# Patient Record
Sex: Male | Born: 1965 | Race: White | Hispanic: No | Marital: Married | Smoking: Current every day smoker
Health system: Southern US, Community
[De-identification: ages and names within clinical notes are randomized; demographics above are authoritative.]

## PROBLEM LIST (undated history)

## (undated) DIAGNOSIS — J449 Chronic obstructive pulmonary disease, unspecified: Secondary | ICD-10-CM

## (undated) DIAGNOSIS — F419 Anxiety disorder, unspecified: Secondary | ICD-10-CM

## (undated) DIAGNOSIS — F32A Depression, unspecified: Secondary | ICD-10-CM

## (undated) DIAGNOSIS — M109 Gout, unspecified: Secondary | ICD-10-CM

## (undated) HISTORY — DX: Anxiety disorder, unspecified: F41.9

## (undated) HISTORY — DX: Depression, unspecified: F32.A

## (undated) HISTORY — PX: ESOPHAGOGASTRODUODENOSCOPY ENDOSCOPY: SHX5814

## (undated) HISTORY — PX: ELBOW SURGERY: SHX618

## (undated) HISTORY — DX: Gout, unspecified: M10.9

---

## 2004-11-05 ENCOUNTER — Ambulatory Visit: Payer: Self-pay

## 2004-12-09 ENCOUNTER — Encounter: Payer: Self-pay | Admitting: Neurosurgery

## 2004-12-16 ENCOUNTER — Encounter: Payer: Self-pay | Admitting: Neurosurgery

## 2005-01-16 ENCOUNTER — Encounter: Payer: Self-pay | Admitting: Neurosurgery

## 2006-07-02 ENCOUNTER — Emergency Department: Payer: Self-pay | Admitting: Emergency Medicine

## 2011-02-05 ENCOUNTER — Ambulatory Visit: Payer: Self-pay | Admitting: Unknown Physician Specialty

## 2011-02-09 LAB — PATHOLOGY REPORT

## 2012-11-17 ENCOUNTER — Emergency Department: Payer: Self-pay | Admitting: Emergency Medicine

## 2012-11-17 LAB — TROPONIN I
Troponin-I: <0.02 ng/mL
Troponin-I: <0.02 ng/mL

## 2012-11-17 LAB — CK TOTAL AND CKMB (NOT AT ARMC)
CK, Total: 154 U/L (ref 35–232)
CK, Total: 117 U/L (ref 35–232)
CK-MB: 1.2 ng/mL (ref 0.5–3.6)

## 2012-11-17 LAB — CBC
HGB: 14.4 g/dL (ref 13.0–18.0)
MCH: 29.3 pg (ref 26.0–34.0)
Platelet: 177 10*3/uL (ref 150–440)
RDW: 12.8 % (ref 11.5–14.5)
WBC: 7.6 10*3/uL (ref 3.8–10.6)

## 2012-11-17 LAB — BASIC METABOLIC PANEL
Anion Gap: 8 (ref 7–16)
BUN: 9 mg/dL (ref 7–18)
Calcium, Total: 8.5 mg/dL (ref 8.5–10.1)
Chloride: 105 mmol/L (ref 98–107)
Co2: 26 mmol/L (ref 21–32)
Creatinine: 0.78 mg/dL (ref 0.60–1.30)
EGFR (African American): >60
Osmolality: 276 (ref 275–301)
Potassium: 4.1 mmol/L (ref 3.5–5.1)

## 2013-04-16 ENCOUNTER — Emergency Department: Payer: Self-pay | Admitting: Emergency Medicine

## 2013-04-16 LAB — URINALYSIS, COMPLETE
Glucose,UR: NEGATIVE mg/dL (ref 0–75)
Nitrite: NEGATIVE
Specific Gravity: 1.029 (ref 1.003–1.030)
Squamous Epithelial: NONE SEEN
WBC UR: 4 /HPF (ref 0–5)

## 2013-04-16 LAB — CBC WITH DIFFERENTIAL/PLATELET
Basophil #: 0.1 10*3/uL (ref 0.0–0.1)
Eosinophil #: 0.1 10*3/uL (ref 0.0–0.7)
HGB: 14.7 g/dL (ref 13.0–18.0)
Lymphocyte #: 1.8 10*3/uL (ref 1.0–3.6)
Monocyte #: 0.4 x10 3/mm (ref 0.2–1.0)
Neutrophil %: 70.5 %
Platelet: 167 10*3/uL (ref 150–440)
RDW: 13.2 % (ref 11.5–14.5)
WBC: 8.2 10*3/uL (ref 3.8–10.6)

## 2013-04-16 LAB — BASIC METABOLIC PANEL
Chloride: 106 mmol/L (ref 98–107)
EGFR (African American): >60
EGFR (Non-African Amer.): >60
Glucose: 129 mg/dL — ABNORMAL HIGH (ref 65–99)
Potassium: 4.1 mmol/L (ref 3.5–5.1)

## 2014-05-29 ENCOUNTER — Ambulatory Visit: Payer: Self-pay

## 2015-11-19 DIAGNOSIS — M1A00X0 Idiopathic chronic gout, unspecified site, without tophus (tophi): Secondary | ICD-10-CM | POA: Insufficient documentation

## 2016-06-17 ENCOUNTER — Emergency Department: Payer: Worker's Compensation

## 2016-06-17 ENCOUNTER — Encounter: Payer: Self-pay | Admitting: Emergency Medicine

## 2016-06-17 ENCOUNTER — Emergency Department
Admission: EM | Admit: 2016-06-17 | Discharge: 2016-06-17 | Disposition: A | Discharge status: Home / Self Care | Payer: Worker's Compensation | Attending: Emergency Medicine | Admitting: Emergency Medicine

## 2016-06-17 ENCOUNTER — Emergency Department: Payer: Self-pay

## 2016-06-17 DIAGNOSIS — X501XXA Overexertion from prolonged static or awkward postures, initial encounter: Secondary | ICD-10-CM | POA: Diagnosis not present

## 2016-06-17 DIAGNOSIS — S39012A Strain of muscle, fascia and tendon of lower back, initial encounter: Secondary | ICD-10-CM | POA: Diagnosis not present

## 2016-06-17 DIAGNOSIS — Y9289 Other specified places as the place of occurrence of the external cause: Secondary | ICD-10-CM | POA: Insufficient documentation

## 2016-06-17 DIAGNOSIS — Y9301 Activity, walking, marching and hiking: Secondary | ICD-10-CM | POA: Insufficient documentation

## 2016-06-17 DIAGNOSIS — S3992XA Unspecified injury of lower back, initial encounter: Secondary | ICD-10-CM | POA: Diagnosis present

## 2016-06-17 DIAGNOSIS — W19XXXA Unspecified fall, initial encounter: Secondary | ICD-10-CM

## 2016-06-17 DIAGNOSIS — F17200 Nicotine dependence, unspecified, uncomplicated: Secondary | ICD-10-CM | POA: Insufficient documentation

## 2016-06-17 DIAGNOSIS — Y990 Civilian activity done for income or pay: Secondary | ICD-10-CM | POA: Insufficient documentation

## 2016-06-17 DIAGNOSIS — S8002XA Contusion of left knee, initial encounter: Secondary | ICD-10-CM | POA: Insufficient documentation

## 2016-06-17 DIAGNOSIS — S7002XA Contusion of left hip, initial encounter: Secondary | ICD-10-CM | POA: Diagnosis not present

## 2016-06-17 MED ORDER — DIAZEPAM 2 MG PO TABS
2.0000 mg | ORAL_TABLET | Freq: Once | ORAL | Status: AC
  Administered 2016-06-17: 2 mg via ORAL
  Filled 2016-06-17: qty 1

## 2016-06-17 MED ORDER — METHOCARBAMOL 500 MG PO TABS: 500.0000 mg | ORAL_TABLET | Freq: Four times a day (QID) | ORAL | 0 refills | Status: DC

## 2016-06-17 MED ORDER — OXYCODONE-ACETAMINOPHEN 5-325 MG PO TABS
1.0000 | ORAL_TABLET | Freq: Once | ORAL | Status: AC
  Administered 2016-06-17: 1 via ORAL
  Filled 2016-06-17: qty 1

## 2016-06-17 MED ORDER — MELOXICAM 15 MG PO TABS: 15.0000 mg | ORAL_TABLET | Freq: Every day | ORAL | 0 refills | Status: DC

## 2016-06-17 MED ORDER — HYDROCODONE-ACETAMINOPHEN 5-325 MG PO TABS: 1.0000 | ORAL_TABLET | ORAL | 0 refills | Status: DC | PRN

## 2016-06-17 NOTE — ED Triage Notes (Signed)
Brought in via ems from work  S/p fall  States he landed on left knee  But also having increased pain to left hip area

## 2016-06-17 NOTE — ED Provider Notes (Signed)
[facility] Emergency Department Provider Note  ____________________________________________  Time seen: Approximately 4:29 PM  I have reviewed the triage vital signs and the nursing notes.   HISTORY  Chief Complaint Fall    HPI Walter Mcclain. is a 50 y.o. male who presents to emergency department via EMS status post a injury at work. Patient states that he was working on the [employer] at [place] when someone pushed the next one more of behind him. Patient reports that he was walking around a lot more he was working on when he did not see the lawnmower, caught his foot, fell a twisting manner and landed on his knee and hip. Patient is reporting mild pain to the left knee but significant pain to the left hip. Patient did not hit his head or lose consciousness. No palliative care prior to arrival.He denies any numbness or tingling in bilateral lotion these. No bowel or bladder dysfunction.   History reviewed. No pertinent past medical history.  There are no active problems to display for this patient.   History reviewed. No pertinent surgical history.  Prior to Admission medications   Medication Sig Start Date End Date Taking? Authorizing Provider  HYDROcodone-acetaminophen (NORCO/VICODIN) 5-325 MG tablet Take 1 tablet by mouth every 4 (four) hours as needed for moderate pain. 06/17/16   Delorise Royals Sharnita Bogucki, PA-C  meloxicam (MOBIC) 15 MG tablet Take 1 tablet (15 mg total) by mouth daily. 06/17/16   Delorise Royals Phuong Moffatt, PA-C  methocarbamol (ROBAXIN) 500 MG tablet Take 1 tablet (500 mg total) by mouth 4 (four) times daily. 06/17/16   Delorise Royals Jakari Sada, PA-C    Allergies Review of patient's allergies indicates no known allergies.  No family history on file.  Social History Social History  Substance Use Topics  . Smoking status: Current Every Day Smoker  . Smokeless tobacco: Never Used  . Alcohol use No     Review of Systems   Constitutional: No fever/chills Cardiovascular: no chest pain. Respiratory: no cough. No SOB. Gastrointestinal: No abdominal pain.  No nausea, no vomiting.   Musculoskeletal: Positive for lower back, left hip, left knee pain Skin: Negative for rash, abrasions, lacerations, ecchymosis. Neurological: Negative for headaches, focal weakness or numbness. 10-point ROS otherwise negative.  ____________________________________________   PHYSICAL EXAM:  VITAL SIGNS: ED Triage Vitals  Enc Vitals Group     BP 06/17/16 1617 129/75     Pulse Rate 06/17/16 1617 75     Resp 06/17/16 1617 20     Temp 06/17/16 1617 98.1 F (36.7 C)     Temp Source 06/17/16 1617 Oral     SpO2 06/17/16 1617 97 %     Weight 06/17/16 1615 280 lb (127 kg)     Height 06/17/16 1615 6' (1.829 m)     Head Circumference --      Peak Flow --      Pain Score 06/17/16 1615 7     Pain Loc --      Pain Edu? --      Excl. in GC? --      Constitutional: Alert and oriented. Well appearing and in no acute distress. Eyes: Conjunctivae are normal. PERRL. EOMI. Head: Atraumatic. Neck: No stridor.  No cervical spine tenderness to palpation.  Cardiovascular: Normal rate, regular rhythm. Normal S1 and S2.  Good peripheral circulation. Respiratory: Normal respiratory effort without tachypnea or retractions. Lungs CTAB. Good air entry to the bases with no decreased or absent breath sounds. Musculoskeletal:  Full range of motion to all extremities. No gross deformities appreciated. No deformities noted to lower back but inspection. Patient has diffuse tenderness to palpation midline and left-sided paraspinal muscle group. No palpable abnormality. No tenderness to palpation over sciatic notch. Patient is mildly tender to palpation over the left lateral hip greater trochanteric region. No palpable abnormality. Full range of motion to the hip. No tenderness to palpation. Patient is nontender to palpation of the left knee. Full range of  motion left knee. Varus, valgus, Lachman's, McMurray's is negative. Sensation intact and equal lower extremities. Dorsalis pedis pulse intact and equal lower extremities. Neurologic:  Normal speech and language. No gross focal neurologic deficits are appreciated.  Skin:  Skin is warm, dry and intact. No rash noted. Psychiatric: Mood and affect are normal. Speech and behavior are normal. Patient exhibits appropriate insight and judgement.   ____________________________________________   LABS (all labs ordered are listed, but only abnormal results are displayed)  Labs Reviewed - No data to display ____________________________________________  EKG   ____________________________________________  RADIOLOGY Festus BarrenI, Kayden Hutmacher D Solara Goodchild, personally viewed and evaluated these images (plain radiographs) as part of my medical decision making, as well as reviewing the written report by the radiologist.  Dg Lumbar Spine Complete  Result Date: 06/17/2016 CLINICAL DATA:  Status post tripping and falling at work with back pain. EXAM: LUMBAR SPINE - COMPLETE 4+ VIEW COMPARISON:  May 29, 2014 FINDINGS: There is no evidence of lumbar spine fracture. There is grade 2 anterior listhesis of L5 on S1 unchanged. IMPRESSION: No acute fracture or dislocation. Electronically Signed   By: Sherian ReinWei-Chen  Lin M.D.   On: 06/17/2016 17:33   Dg Knee Complete 4 Views Left  Result Date: 06/17/2016 CLINICAL DATA:  Status post tripping and falling while at work with left knee pain EXAM: LEFT KNEE - COMPLETE 4+ VIEW COMPARISON:  None. FINDINGS: No evidence of fracture, dislocation, or joint effusion. No evidence of arthropathy or other focal bone abnormality. Soft tissues are unremarkable. IMPRESSION: Negative. Electronically Signed   By: Sherian ReinWei-Chen  Lin M.D.   On: 06/17/2016 17:30   Dg Hip Unilat With Pelvis 2-3 Views Left  Result Date: 06/17/2016 CLINICAL DATA:  Status post tripping and falling while at work with left hip pain  EXAM: DG HIP (WITH OR WITHOUT PELVIS) 2-3V LEFT COMPARISON:  None. FINDINGS: There is no evidence of hip fracture or dislocation. There are minimal decreased bilateral hip joint spaces. IMPRESSION: No acute fracture or dislocation. Electronically Signed   By: Sherian ReinWei-Chen  Lin M.D.   On: 06/17/2016 17:31    ____________________________________________    PROCEDURES  Procedure(s) performed:    Procedures    Medications  oxyCODONE-acetaminophen (PERCOCET/ROXICET) 5-325 MG per tablet 1 tablet (1 tablet Oral Given 06/17/16 1649)  diazepam (VALIUM) tablet 2 mg (2 mg Oral Given 06/17/16 1649)     ____________________________________________   INITIAL IMPRESSION / ASSESSMENT AND PLAN / ED COURSE  Pertinent labs & imaging results that were available during my care of the patient were reviewed by me and considered in my medical decision making (see chart for details).  Review of the [place] CSRS was performed in accordance of the [facility] prior to dispensing any controlled drugs.  Clinical Course    Patient's diagnosis is consistent with A fall resulting in left knee and hip contusions, and lumbar strain. X-ray reveals no acute osseous abnormality. Exam is reassuring. Patient reports improvement in symptoms after Valium and Percocet.. Patient will be discharged home with prescriptions for anti-inflammatories, muscle  relaxer, and very limited pain medication. Patient is to follow up with primary care provider as needed or otherwise directed. Patient is given ED precautions to return to the ED for any worsening or new symptoms.     ____________________________________________  FINAL CLINICAL IMPRESSION(S) / ED DIAGNOSES  Final diagnoses:  Fall, initial encounter  Contusion of left hip, initial encounter  Knee contusion, left, initial encounter  Lumbar strain, initial encounter      NEW MEDICATIONS STARTED DURING THIS VISIT:  New Prescriptions   HYDROCODONE-ACETAMINOPHEN (NORCO/VICODIN)  5-325 MG TABLET    Take 1 tablet by mouth every 4 (four) hours as needed for moderate pain.   MELOXICAM (MOBIC) 15 MG TABLET    Take 1 tablet (15 mg total) by mouth daily.   METHOCARBAMOL (ROBAXIN) 500 MG TABLET    Take 1 tablet (500 mg total) by mouth 4 (four) times daily.        This chart was dictated using voice recognition software/Dragon. Despite best efforts to proofread, errors can occur which can change the meaning. Any change was purely unintentional.    Racheal Patches, PA-C 06/17/16 1753    Rockne Menghini, MD 06/17/16 2000

## 2016-06-17 NOTE — ED Notes (Signed)
Pt completed workman's comp and and COC, pt provided urine specimen, pt given his copy of COC, COC and specimen hand delivered to lab

## 2018-06-17 IMAGING — CR DG LUMBAR SPINE COMPLETE 4+V
5 series · 5 of 5 positions shown · non-contrast
Comparison: May 29, 2014

CLINICAL DATA: Status post tripping and falling at work with back
pain.

EXAM:
LUMBAR SPINE - COMPLETE 4+ VIEW

[l-spine ap]
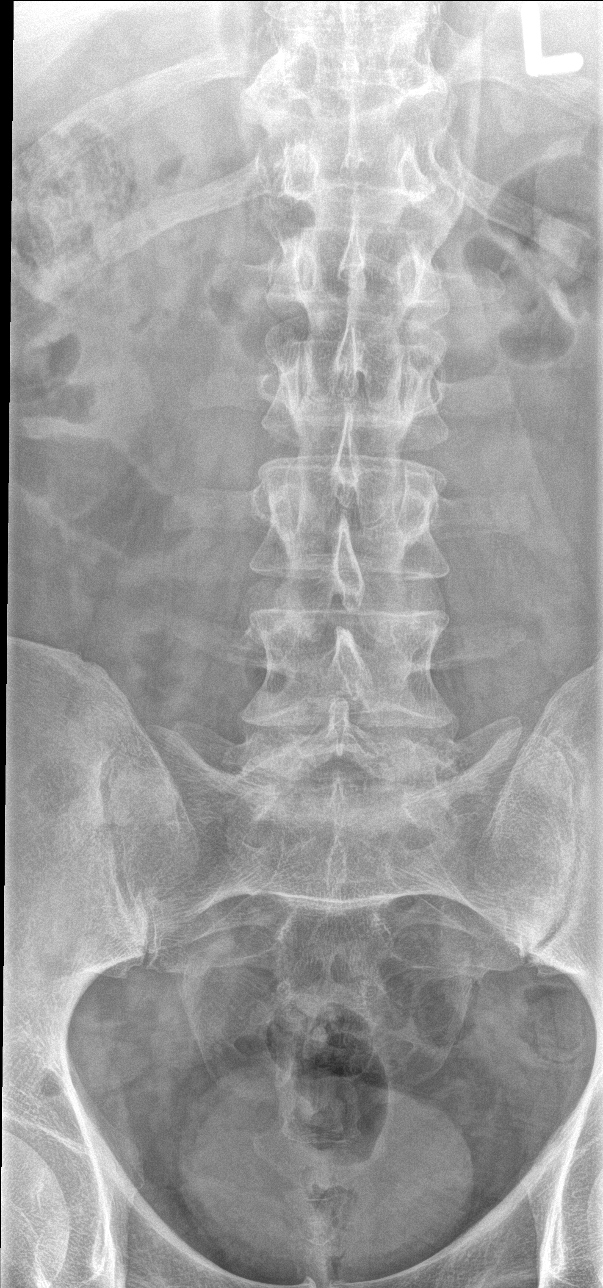

[l-spine obl (1 of 2)]
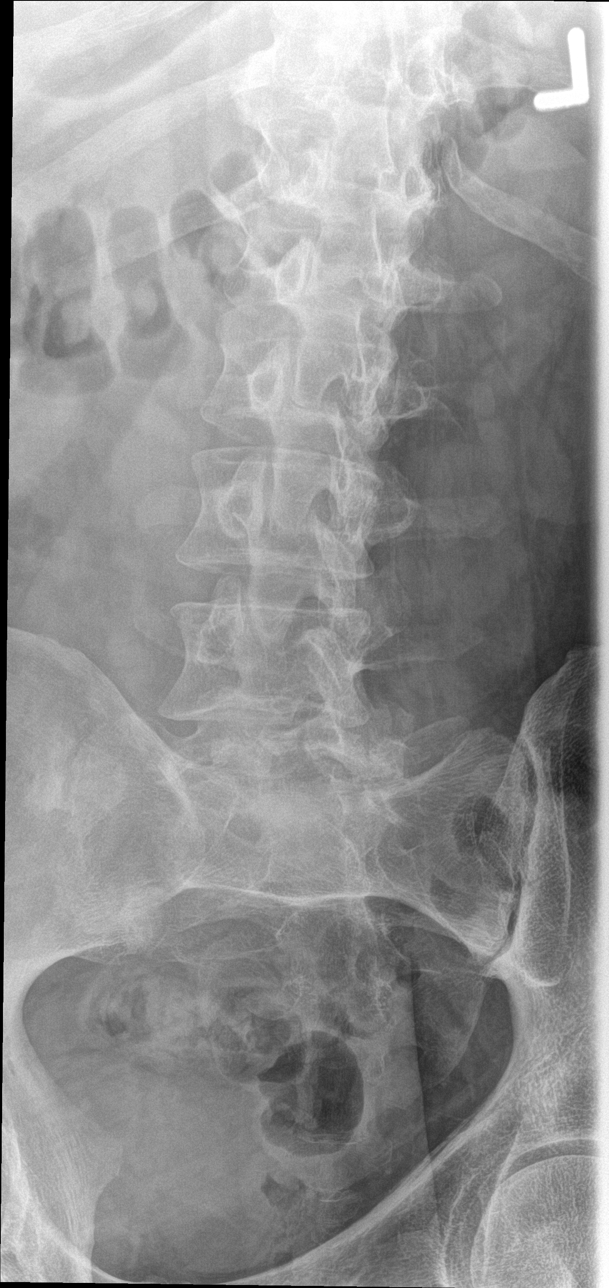

[l-spine obl (2 of 2)]
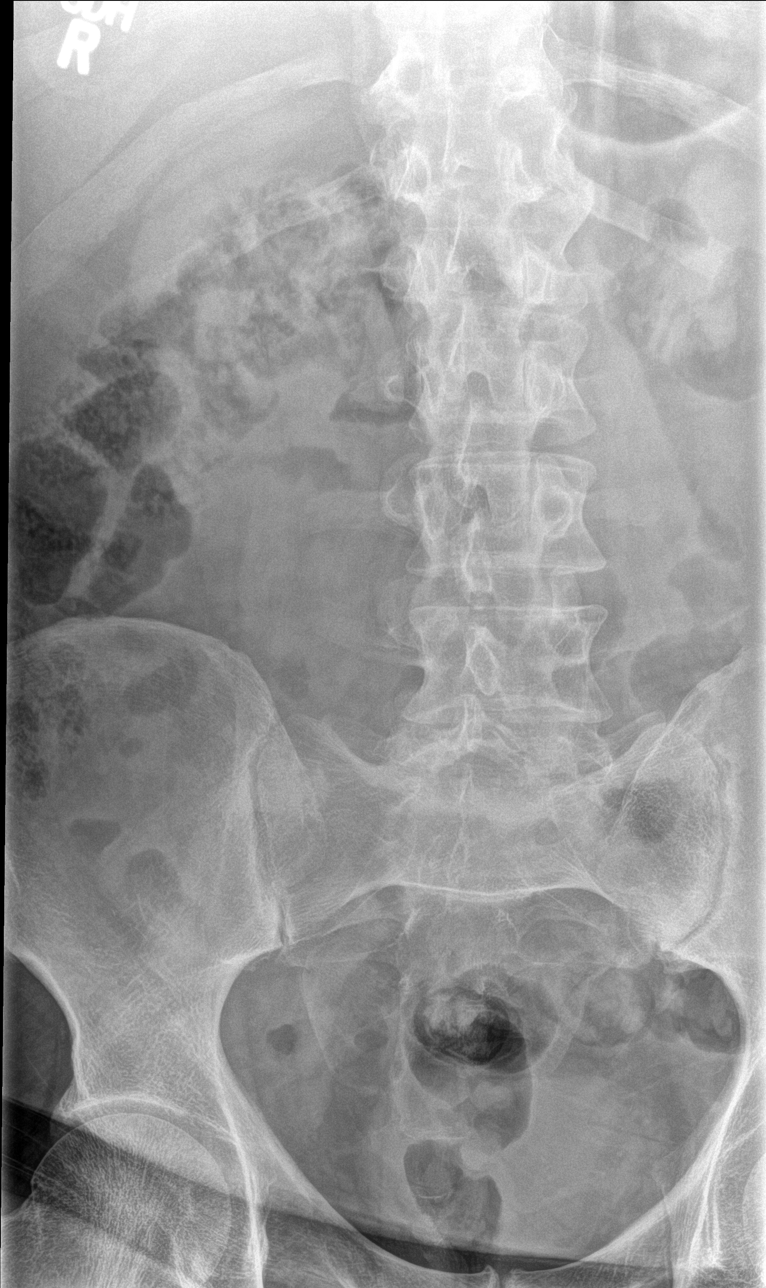

[l-spine lat]
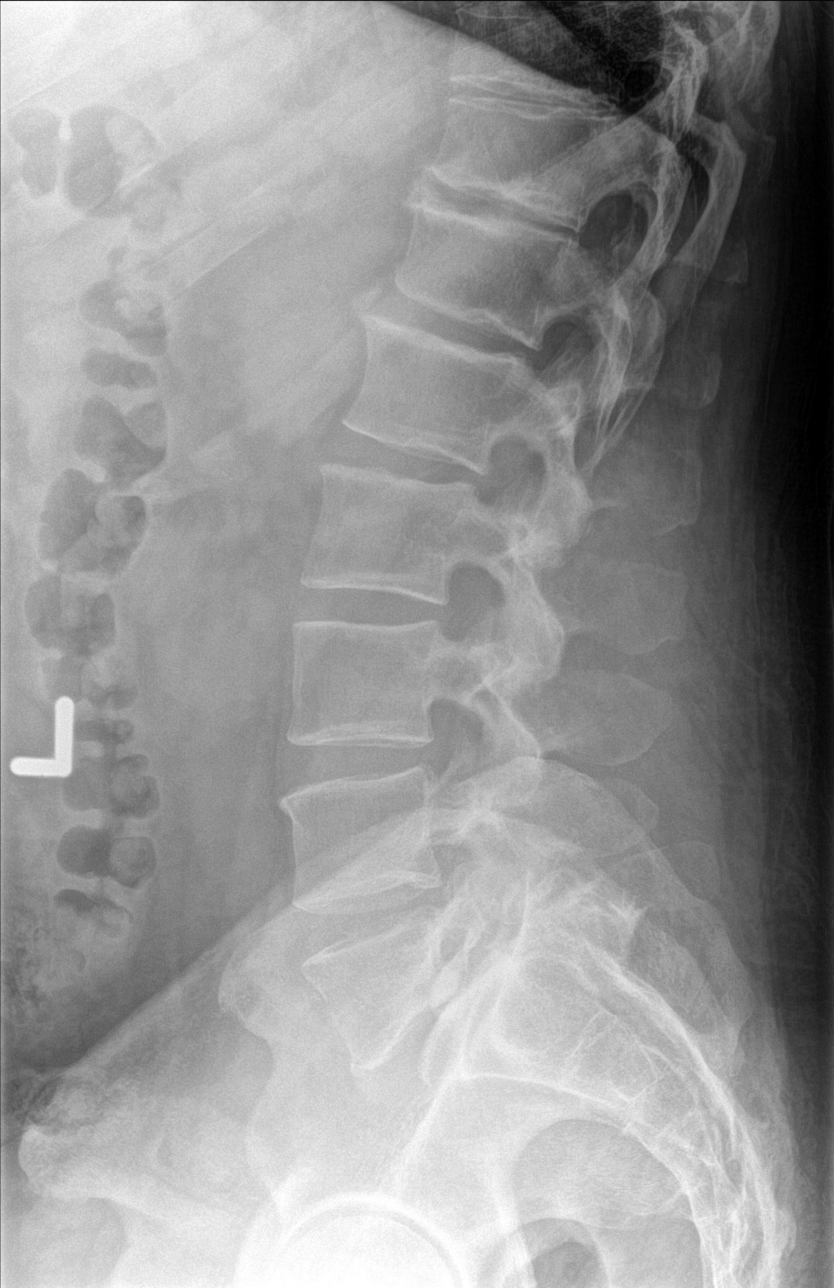

[l-spine spot]
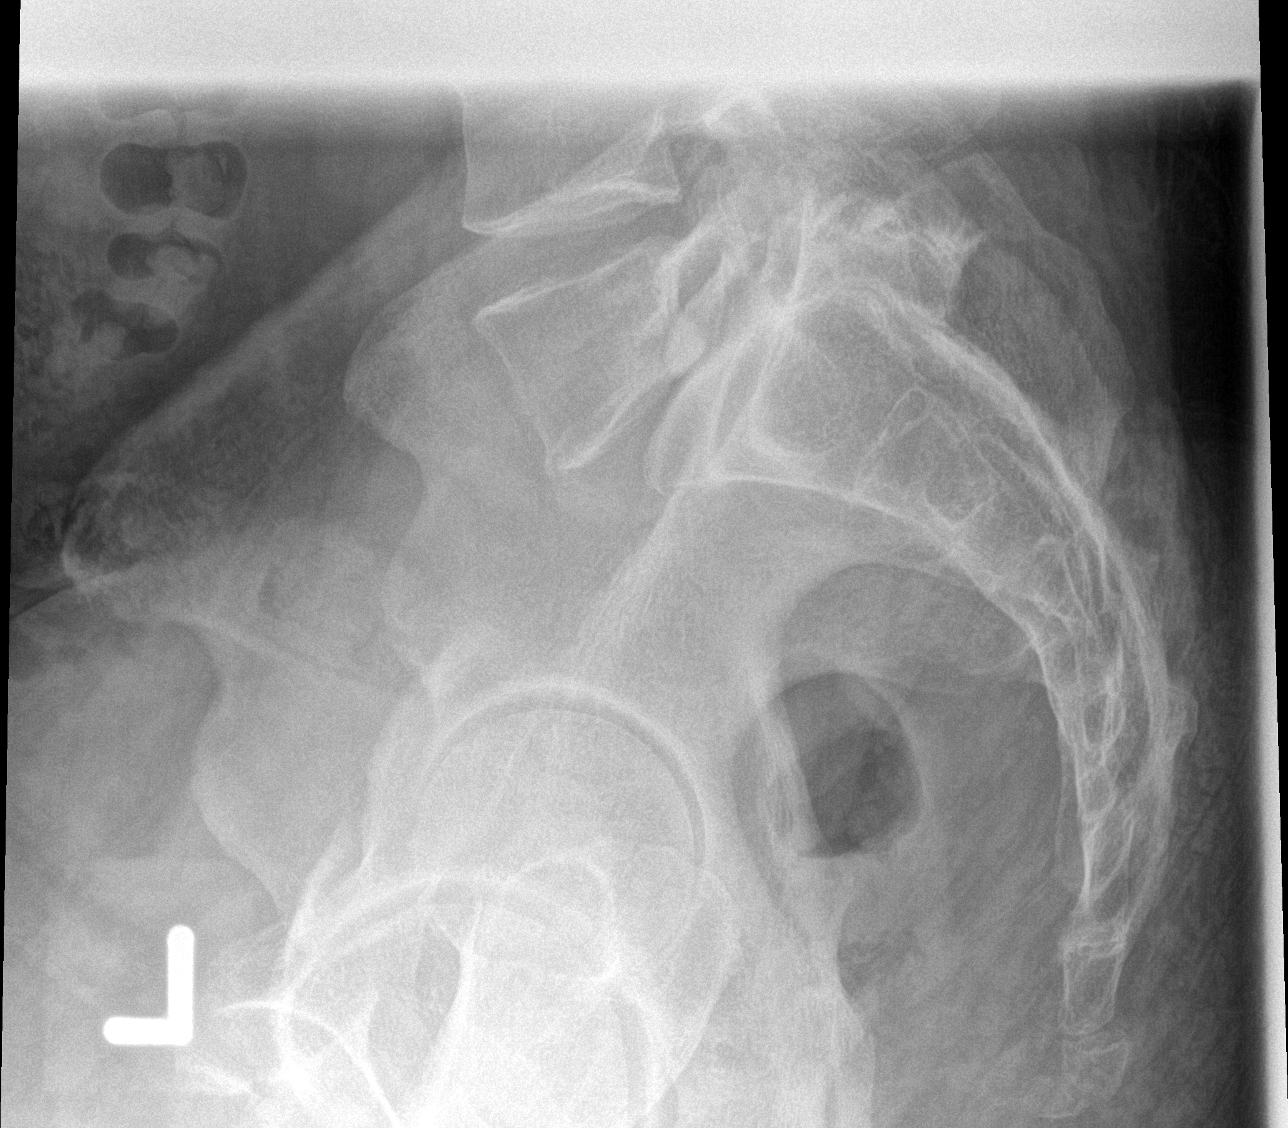

[5 of 5 positions shown; findings below may reference images not displayed]

FINDINGS: There is no evidence of lumbar spine fracture. There is grade 2
anterior listhesis of L5 on S1 unchanged.
IMPRESSION: No acute fracture or dislocation.

## 2020-04-01 ENCOUNTER — Ambulatory Visit: Payer: Managed Care, Other (non HMO) | Admitting: Family Medicine

## 2020-04-01 ENCOUNTER — Encounter: Payer: Self-pay | Admitting: Family Medicine

## 2020-04-01 ENCOUNTER — Other Ambulatory Visit: Payer: Self-pay

## 2020-04-01 VITALS — BP 130/88 | HR 73 | Temp 97.6°F | Ht 69.75 in | Wt 330.0 lb

## 2020-04-01 DIAGNOSIS — Z6841 Body Mass Index (BMI) 40.0 and over, adult: Secondary | ICD-10-CM

## 2020-04-01 DIAGNOSIS — Z7689 Persons encountering health services in other specified circumstances: Secondary | ICD-10-CM

## 2020-04-01 DIAGNOSIS — M1A0710 Idiopathic chronic gout, right ankle and foot, without tophus (tophi): Secondary | ICD-10-CM | POA: Diagnosis not present

## 2020-04-01 DIAGNOSIS — E669 Obesity, unspecified: Secondary | ICD-10-CM | POA: Insufficient documentation

## 2020-04-01 DIAGNOSIS — M545 Low back pain, unspecified: Secondary | ICD-10-CM | POA: Insufficient documentation

## 2020-04-01 DIAGNOSIS — G8929 Other chronic pain: Secondary | ICD-10-CM

## 2020-04-01 DIAGNOSIS — F17210 Nicotine dependence, cigarettes, uncomplicated: Secondary | ICD-10-CM | POA: Insufficient documentation

## 2020-04-01 DIAGNOSIS — K625 Hemorrhage of anus and rectum: Secondary | ICD-10-CM

## 2020-04-01 DIAGNOSIS — M109 Gout, unspecified: Secondary | ICD-10-CM | POA: Insufficient documentation

## 2020-04-01 MED ORDER — INDOMETHACIN 50 MG PO CAPS: 50.0000 mg | ORAL_CAPSULE | Freq: Two times a day (BID) | ORAL | 0 refills | Status: DC

## 2020-04-01 MED ORDER — BUPROPION HCL ER (XL) 150 MG PO TB24: 150.0000 mg | ORAL_TABLET | Freq: Every day | ORAL | 0 refills | Status: DC

## 2020-04-01 NOTE — Patient Instructions (Addendum)
Tart Cherry supplements to help naturally lower uric acid levels   Low-Purine Eating Plan A low-purine eating plan involves making food choices to limit your intake of purine. Purine is a kind of uric acid. Too much uric acid in your blood can cause certain conditions, such as gout and kidney stones. Eating a low-purine diet can help control these conditions. What are tips for following this plan? Reading food labels   Avoid foods with saturated or Trans fat.  Check the ingredient list of grains-based foods, such as bread and cereal, to make sure that they contain whole grains.  Check the ingredient list of sauces or soups to make sure they do not contain meat or fish.  When choosing soft drinks, check the ingredient list to make sure they do not contain high-fructose corn syrup. Shopping  Buy plenty of fresh fruits and vegetables.  Avoid buying canned or fresh fish.  Buy dairy products labeled as low-fat or nonfat.  Avoid buying premade or processed foods. These foods are often high in fat, salt (sodium), and added sugar. Cooking  Use olive oil instead of butter when cooking. Oils like olive oil, canola oil, and sunflower oil contain healthy fats. Meal planning  Learn which foods do or do not affect you. If you find out that a food tends to cause your gout symptoms to flare up, avoid eating that food. You can enjoy foods that do not cause problems. If you have any questions about a food item, talk with your dietitian or health care provider.  Limit foods high in fat, especially saturated fat. Fat makes it harder for your body to get rid of uric acid.  Choose foods that are lower in fat and are lean sources of protein. General guidelines  Limit alcohol intake to no more than 1 drink a day for nonpregnant women and 2 drinks a day for men. One drink equals 12 oz of beer, 5 oz of wine, or 1 oz of hard liquor. Alcohol can affect the way your body gets rid of uric acid.  Drink  plenty of water to keep your urine clear or pale yellow. Fluids can help remove uric acid from your body.  If directed by your health care provider, take a vitamin C supplement.  Work with your health care provider and dietitian to develop a plan to achieve or maintain a healthy weight. Losing weight can help reduce uric acid in your blood. What foods are recommended? The items listed may not be a complete list. Talk with your dietitian about what dietary choices are best for you. Foods low in purines Foods low in purines do not need to be limited. These include:  All fruits.  All low-purine vegetables, pickles, and olives.  Breads, pasta, rice, cornbread, and popcorn. Cake and other baked goods.  All dairy foods.  Eggs, nuts, and nut butters.  Spices and condiments, such as salt, herbs, and vinegar.  Plant oils, butter, and margarine.  Water, sugar-free soft drinks, tea, coffee, and cocoa.  Vegetable-based soups, broths, sauces, and gravies. Foods moderate in purines Foods moderate in purines should be limited to the amounts listed.   cup of asparagus, cauliflower, spinach, mushrooms, or green peas, each day.  2/3 cup uncooked oatmeal, each day.   cup dry wheat bran or wheat germ, each day.  2-3 ounces of meat or poultry, each day.  4-6 ounces of shellfish, such as crab, lobster, oysters, or shrimp, each day.  1 cup cooked beans, peas, or  lentils, each day.  Soup, broths, or bouillon made from meat or fish. Limit these foods as much as possible. What foods are not recommended? The items listed may not be a complete list. Talk with your dietitian about what dietary choices are best for you. Limit your intake of foods high in purines, including:  Beer and other alcohol.  Meat-based gravy or sauce.  Canned or fresh fish, such as: ? Anchovies, sardines, herring, and tuna. ? Mussels and scallops. ? Codfish, trout, and haddock.  Berniece Salines.  Organ meats, such  as: ? Liver or kidney. ? Tripe. ? Sweetbreads (thymus gland or pancreas).  Wild [employer] biochemist.  Yeast or yeast extract supplements.  Drinks sweetened with high-fructose corn syrup. Summary  Eating a low-purine diet can help control conditions caused by too much uric acid in the body, such as gout or kidney stones.  Choose low-purine foods, limit alcohol, and limit foods high in fat.  You will learn over time which foods do or do not affect you. If you find out that a food tends to cause your gout symptoms to flare up, avoid eating that food. This information is not intended to replace advice given to you by your health care provider. Make sure you discuss any questions you have with your health care provider. Document Revised: 09/16/2017 Document Reviewed: 11/17/2016 Elsevier Patient Education  2020 [employer].

## 2020-04-01 NOTE — Assessment & Plan Note (Signed)
Followed by [facility] for pain and therapy regimen, continue per their recommendations

## 2020-04-01 NOTE — Progress Notes (Signed)
BP 130/88 (BP Location: Left Arm, Patient Position: Sitting, Cuff Size: Large)   Pulse 73   Temp 97.6 F (36.4 C) (Oral)   Ht 5' 9.75" (1.772 m)   Wt (!) 330 lb (149.7 kg)   SpO2 96%   BMI 47.69 kg/m    Subjective:    Patient ID: Walter Carrow., male    DOB: [birth year], 54 y.o.   MRN: [id number]  HPI: Walter Degraffenreid. is a 54 y.o. male  Chief Complaint  Patient presents with  . Establish Care  . Edema    Swelling in both feet. Swelling in in left leg, left leg has new unusual pain.   . Rectal Bleeding  . Gout    Was previously on cochicine in 2018. Patient requesting refill for gout flares.    Here today to establish care.   Hx of gout, typically in right foot. Last seen for this in 2017 and has been on colchicine prn which he's been out of for over a year. Tries to control diet and fluid intake. Has flares off and on for which he takes OTC pain relievers  SOB at times with exertion, notes he's gained 50ish lb the past year since being out of work and thinks this is a big part of it. Smokes almost a ppd, has for many years. Tried chantix last year and had lip swelling with that. Has also tried patches which he did well with in the past but did not continue long term due to cost. Denies CP, diaphoresis, hx of cardiovascular dz.   Had an episode about 3 weeks ago with bright red rectal bleeding that happened after a BM and ceased after wiping. Notes he had eaten spicy cheetos prior to this episode. Denies anal pain, abdominal pain, constipation, hx of bowel disease. Due for colonoscopy.   Had an injury while working at [employer] several years ago, herniated several discs and has ongoing back issues/chronic pain. Following with [facility] for these issues, on percocet TID through them. Hoping to eventually have surgery.   Relevant past medical, surgical, family and social history reviewed and updated as indicated. Interim medical history since our last visit  reviewed. Allergies and medications reviewed and updated.  Review of Systems  Per HPI unless specifically indicated above     Objective:    BP 130/88 (BP Location: Left Arm, Patient Position: Sitting, Cuff Size: Large)   Pulse 73   Temp 97.6 F (36.4 C) (Oral)   Ht 5' 9.75" (1.772 m)   Wt (!) 330 lb (149.7 kg)   SpO2 96%   BMI 47.69 kg/m   Wt Readings from Last 3 Encounters:  04/01/20 (!) 330 lb (149.7 kg)  06/17/16 280 lb (127 kg)    Physical Exam Vitals and nursing note reviewed.  Constitutional:      Appearance: Normal appearance.  HENT:     Head: Atraumatic.  Eyes:     Extraocular Movements: Extraocular movements intact.     Conjunctiva/sclera: Conjunctivae normal.  Cardiovascular:     Rate and Rhythm: Normal rate and regular rhythm.  Pulmonary:     Effort: Pulmonary effort is normal.     Breath sounds: Normal breath sounds.  Abdominal:     General: Bowel sounds are normal. There is no distension.     Palpations: Abdomen is soft.     Tenderness: There is no abdominal tenderness. There is no right CVA tenderness, left CVA tenderness or guarding.  Genitourinary:  Comments: Declined by pt Musculoskeletal:        General: Normal range of motion.     Cervical back: Normal range of motion and neck supple.  Skin:    General: Skin is warm and dry.  Neurological:     General: No focal deficit present.     Mental Status: He is oriented to person, place, and time.  Psychiatric:        Mood and Affect: Mood normal.        Thought Content: Thought content normal.        Judgment: Judgment normal.     No results found for this or any previous visit.    Assessment & Plan:   Problem List Items Addressed This Visit      Other   Gout - Primary    Indomethacin prn, tart cherry supplements, diet changes. Recheck levels at upcoming CPE and may need to start allopurinol      Cigarette smoker    Will start wellbutrin, work on getting NRT covered (pt will call to  see if there are any cessation programs through insurance). Discussed quit date goals etc      Obesity    Start wellbutrin, restart exercise program and work on diet improvements. Continue to monitor      Chronic low back pain    Followed by [facility] for pain and therapy regimen, continue per their recommendations      Relevant Medications   oxyCODONE-acetaminophen (PERCOCET) 7.5-325 MG tablet   indomethacin (INDOCIN) 50 MG capsule    Other Visit Diagnoses    Encounter to establish care       Rectal bleeding       Suspect hemorrhoids, pt declines exam at this time as well as referral for colonoscopy. Continue to monitor, supportive care reviewed. F/u if recurring       Follow up plan: Return in about 4 weeks (around 04/29/2020) for CPE.

## 2020-04-01 NOTE — Assessment & Plan Note (Signed)
Will start wellbutrin, work on getting NRT covered (pt will call to see if there are any cessation programs through insurance). Discussed quit date goals etc

## 2020-04-01 NOTE — Assessment & Plan Note (Signed)
Start wellbutrin, restart exercise program and work on diet improvements. Continue to monitor

## 2020-04-01 NOTE — Assessment & Plan Note (Signed)
Indomethacin prn, tart cherry supplements, diet changes. Recheck levels at upcoming CPE and may need to start allopurinol

## 2020-04-23 ENCOUNTER — Other Ambulatory Visit: Payer: Self-pay | Admitting: Family Medicine

## 2020-04-24 ENCOUNTER — Other Ambulatory Visit: Payer: Self-pay | Admitting: Family Medicine

## 2020-04-28 ENCOUNTER — Encounter: Payer: Managed Care, Other (non HMO) | Admitting: Family Medicine

## 2020-05-08 ENCOUNTER — Other Ambulatory Visit: Payer: Self-pay

## 2020-05-08 ENCOUNTER — Encounter: Payer: Self-pay | Admitting: Family Medicine

## 2020-05-08 ENCOUNTER — Ambulatory Visit (INDEPENDENT_AMBULATORY_CARE_PROVIDER_SITE_OTHER): Payer: Managed Care, Other (non HMO) | Admitting: Family Medicine

## 2020-05-08 VITALS — BP 122/83 | HR 71 | Temp 97.9°F | Ht 70.0 in | Wt 319.0 lb

## 2020-05-08 DIAGNOSIS — Z1159 Encounter for screening for other viral diseases: Secondary | ICD-10-CM

## 2020-05-08 DIAGNOSIS — M545 Low back pain, unspecified: Secondary | ICD-10-CM

## 2020-05-08 DIAGNOSIS — M1A0710 Idiopathic chronic gout, right ankle and foot, without tophus (tophi): Secondary | ICD-10-CM | POA: Diagnosis not present

## 2020-05-08 DIAGNOSIS — Z6841 Body Mass Index (BMI) 40.0 and over, adult: Secondary | ICD-10-CM

## 2020-05-08 DIAGNOSIS — Z23 Encounter for immunization: Secondary | ICD-10-CM

## 2020-05-08 DIAGNOSIS — Z125 Encounter for screening for malignant neoplasm of prostate: Secondary | ICD-10-CM | POA: Diagnosis not present

## 2020-05-08 DIAGNOSIS — R0602 Shortness of breath: Secondary | ICD-10-CM

## 2020-05-08 DIAGNOSIS — Z1211 Encounter for screening for malignant neoplasm of colon: Secondary | ICD-10-CM | POA: Diagnosis not present

## 2020-05-08 DIAGNOSIS — G8929 Other chronic pain: Secondary | ICD-10-CM

## 2020-05-08 DIAGNOSIS — F17210 Nicotine dependence, cigarettes, uncomplicated: Secondary | ICD-10-CM | POA: Diagnosis not present

## 2020-05-08 DIAGNOSIS — J449 Chronic obstructive pulmonary disease, unspecified: Secondary | ICD-10-CM

## 2020-05-08 DIAGNOSIS — Z0000 Encounter for general adult medical examination without abnormal findings: Secondary | ICD-10-CM | POA: Diagnosis not present

## 2020-05-08 DIAGNOSIS — Z136 Encounter for screening for cardiovascular disorders: Secondary | ICD-10-CM

## 2020-05-08 DIAGNOSIS — Z114 Encounter for screening for human immunodeficiency virus [HIV]: Secondary | ICD-10-CM

## 2020-05-08 LAB — UA/M W/RFLX CULTURE, ROUTINE
Bilirubin, UA: NEGATIVE
Glucose, UA: NEGATIVE
Ketones, UA: NEGATIVE
Leukocytes,UA: NEGATIVE
Nitrite, UA: NEGATIVE
Protein,UA: NEGATIVE
RBC, UA: NEGATIVE
Specific Gravity, UA: >1.03 — ABNORMAL HIGH (ref 1.005–1.030)
Urobilinogen, Ur: 0.2 mg/dL (ref 0.2–1.0)
pH, UA: 5 (ref 5.0–7.5)

## 2020-05-08 MED ORDER — BUPROPION HCL ER (XL) 300 MG PO TB24: 300.0000 mg | ORAL_TABLET | Freq: Every day | ORAL | 0 refills | Status: DC

## 2020-05-08 MED ORDER — ALBUTEROL SULFATE (2.5 MG/3ML) 0.083% IN NEBU
2.5000 mg | INHALATION_SOLUTION | Freq: Once | RESPIRATORY_TRACT | Status: AC
  Administered 2020-05-08: 2.5 mg via RESPIRATORY_TRACT

## 2020-05-08 MED ORDER — ALBUTEROL SULFATE HFA 108 (90 BASE) MCG/ACT IN AERS: 2.0000 | INHALATION_SPRAY | Freq: Four times a day (QID) | RESPIRATORY_TRACT | 2 refills | Status: DC | PRN

## 2020-05-08 MED ORDER — INDOMETHACIN 50 MG PO CAPS: 50.0000 mg | ORAL_CAPSULE | Freq: Two times a day (BID) | ORAL | 0 refills | Status: DC | PRN

## 2020-05-08 MED ORDER — ANORO ELLIPTA 62.5-25 MCG/INH IN AEPB: 1.0000 | INHALATION_SPRAY | Freq: Every day | RESPIRATORY_TRACT | 2 refills | Status: DC

## 2020-05-08 NOTE — Progress Notes (Signed)
BP 122/83   Pulse 71   Temp 97.9 F (36.6 C) (Oral)   Ht 5\' 10"  (1.778 m)   Wt (!) 319 lb (144.7 kg)   SpO2 97%   BMI 45.77 kg/m    Subjective:    Patient ID: ., male    DOB: 01-19-1966, 53 y.o.   MRN: [id number]  HPI: Walter Mcclain. is a 54 y.o. male presenting on 05/08/2020 for comprehensive medical examination. Current medical complaints include:see below  Gout - has indomethacin for prn use, no recent gout flares. Tries to avoid trigger foods.   Obesity - down 11 lb since last visit by cutting back on breads and evening snacks. Only drinking about 1 soda daily right now. Wanting to start going to the gym with his wife soon.   Smoking - still working on quitting, on average smoking 1/2 ppd - 1 ppd, worse if he's fishing or doing something else similar. Dealing with worsening SOB, particularly with exertional activity. Has never been tested with a spirometer or on inhaler therapy. Some wheezing, chest tightness at times.   Chronic low back pain - Followed by orthopedics, currently on TID oxycodone.   He currently lives with: Interim Problems from his last visit: no  Depression Screen done today and results listed below:  Depression screen [facility] 2/9 05/08/2020  Decreased Interest 2  Down, Depressed, Hopeless 0  PHQ - 2 Score 2  Altered sleeping 0  Tired, decreased energy 1  Change in appetite 0  Feeling bad or failure about yourself  0  Trouble concentrating 0  Moving slowly or fidgety/restless 0  Suicidal thoughts 0  PHQ-9 Score 3    The patient does not have a history of falls. I did complete a risk assessment for falls. A plan of care for falls was documented.   Past Medical History:  History reviewed. No pertinent past medical history.  Surgical History:  History reviewed. No pertinent surgical history.  Medications:  Current Outpatient Medications on File Prior to Visit  Medication Sig  . oxyCODONE-acetaminophen (PERCOCET) 7.5-325  MG tablet Take 1 tablet by mouth 3 (three) times daily.   No current facility-administered medications on file prior to visit.    Allergies:  Allergies  Allergen Reactions  . Chantix [Varenicline] Other (See Comments)    Lip swelling    Social History:  Social History   Socioeconomic History  . Marital status: Married    Spouse name: Not on file  . Number of children: Not on file  . Years of education: Not on file  . Highest education level: Not on file  Occupational History  . Not on file  Tobacco Use  . Smoking status: Current Every Day Smoker    Packs/day: 1.00    Types: Cigarettes  . Smokeless tobacco: Never Used  Substance and Sexual Activity  . Alcohol use: No  . Drug use: Not Currently    Types: Marijuana    Comment: Patient states he does a "Gummy" every once in a while.   02-15-1975 Sexual activity: Not on file  Other Topics Concern  . Not on file  Social History Narrative  . Not on file   Social Determinants of Health   Financial Resource Strain:   . Difficulty of Paying Living Expenses:   Food Insecurity:   . Worried About Marland Kitchen in the Last Year:   . Programme researcher, broadcasting/film/video in the Last Year:   Transportation Needs:   .  Lack of Transportation (Medical):   Marland Kitchen Lack of Transportation (Non-Medical):   Physical Activity:   . Days of Exercise per Week:   . Minutes of Exercise per Session:   Stress:   . Feeling of Stress :   Social Connections:   . Frequency of Communication with Friends and Family:   . Frequency of Social Gatherings with Friends and Family:   . Attends Religious Services:   . Active Member of Clubs or Organizations:   . Attends Banker Meetings:   Marland Kitchen Marital Status:   Intimate Partner Violence:   . Fear of Current or Ex-Partner:   . Emotionally Abused:   Marland Kitchen Physically Abused:   . Sexually Abused:    Social History   Tobacco Use  Smoking Status Current Every Day Smoker  . Packs/day: 1.00  . Types: Cigarettes    Smokeless Tobacco Never Used   Social History   Substance and Sexual Activity  Alcohol Use No    Family History:  Family History  Problem Relation Age of Onset  . Brain cancer Mother   . Liver disease Father   . Heart disease Maternal Grandmother   . Liver disease Paternal Grandmother   . Other Paternal Grandfather        MVA    Past medical history, surgical history, medications, allergies, family history and social history reviewed with patient today and changes made to appropriate areas of the chart.   Review of Systems - General ROS: negative Psychological ROS: negative Ophthalmic ROS: negative ENT ROS: negative Allergy and Immunology ROS: negative Hematological and Lymphatic ROS: negative Endocrine ROS: negative Breast ROS: negative for breast lumps Respiratory ROS: positive for - shortness of breath Cardiovascular ROS: no chest pain or dyspnea on exertion Gastrointestinal ROS: no abdominal pain, change in bowel habits, or black or bloody stools Genito-Urinary ROS: no dysuria, trouble voiding, or hematuria Musculoskeletal ROS: negative Neurological ROS: no TIA or stroke symptoms Dermatological ROS: negative All other ROS negative except what is listed above and in the HPI.      Objective:    BP 122/83   Pulse 71   Temp 97.9 F (36.6 C) (Oral)   Ht  (1.778 m)   Wt (!) 319 lb (144.7 kg)   SpO2 97%   BMI 45.77 kg/m   Wt Readings from Last 3 Encounters:  05/08/20 (!) 319 lb (144.7 kg)  04/01/20 (!) 330 lb (149.7 kg)  06/17/16 280 lb (127 kg)    Physical Exam Vitals and nursing note reviewed.  Constitutional:      General: He is not in acute distress.    Appearance: He is well-developed.  HENT:     Head: Atraumatic.     Right Ear: Tympanic membrane and external ear normal.     Left Ear: Tympanic membrane and external ear normal.     Nose: Nose normal.     Mouth/Throat:     Mouth: Mucous membranes are moist.     Pharynx: Oropharynx is clear.   Eyes:     General: No scleral icterus.    Conjunctiva/sclera: Conjunctivae normal.     Pupils: Pupils are equal, round, and reactive to light.  Cardiovascular:     Rate and Rhythm: Normal rate and regular rhythm.     Heart sounds: Normal heart sounds. No murmur heard.   Pulmonary:     Effort: Pulmonary effort is normal. No respiratory distress.     Breath sounds: Wheezing (minimal, diffuse) present.  Abdominal:  General: Bowel sounds are normal. There is no distension.     Palpations: Abdomen is soft. There is no mass.     Tenderness: There is no abdominal tenderness. There is no guarding.  Genitourinary:    Comments: Declines GU exam Musculoskeletal:        General: No tenderness. Normal range of motion.     Cervical back: Normal range of motion and neck supple.  Skin:    General: Skin is warm and dry.     Findings: No rash.  Neurological:     General: No focal deficit present.     Mental Status: He is alert and oriented to person, place, and time.     Deep Tendon Reflexes: Reflexes are normal and symmetric.  Psychiatric:        Mood and Affect: Mood normal.        Behavior: Behavior normal.        Thought Content: Thought content normal.        Judgment: Judgment normal.     Results for orders placed or performed in visit on 05/08/20  HIV Antibody (routine testing w rflx)  Result Value Ref Range   HIV Screen 4th Generation wRfx Non Reactive Non Reactive  Hepatitis C antibody  Result Value Ref Range   Hep C Virus Ab <0.1 0.0 - 0.9 s/co ratio  CBC with Differential/Platelet  Result Value Ref Range   WBC 7.1 3.4 - 10.8 x10E3/uL   RBC 5.39 4.14 - 5.80 x10E6/uL   Hemoglobin 15.7 13.0 - 17.7 g/dL   Hematocrit 05.3 97.6 - 51.0 %   MCV 85 79 - 97 fL   MCH 29.1 26.6 - 33.0 pg   MCHC 34.3 31 - 35 g/dL   RDW 73.4 19.3 - 79.0 %   Platelets 204 150 - 450 x10E3/uL   Neutrophils 61 Not Estab. %   Lymphs 29 Not Estab. %   Monocytes 5 Not Estab. %   Eos 3 Not Estab. %    Basos 1 Not Estab. %   Neutrophils Absolute 4.4 1 - 7 x10E3/uL   Lymphocytes Absolute 2.1 0 - 3 x10E3/uL   Monocytes Absolute 0.4 0 - 0 x10E3/uL   EOS (ABSOLUTE) 0.2 0.0 - 0.4 x10E3/uL   Basophils Absolute 0.1 0 - 0 x10E3/uL   Immature Granulocytes 1 Not Estab. %   Immature Grans (Abs) 0.0 0.0 - 0.1 x10E3/uL  Comprehensive metabolic panel  Result Value Ref Range   Glucose 104 (H) 65 - 99 mg/dL   BUN 9 6 - 24 mg/dL   Creatinine, Ser 2.40 0.76 - 1.27 mg/dL   GFR calc non Af Amer 80 >59 mL/min/1.73   GFR calc Af Amer 93 >59 mL/min/1.73   BUN/Creatinine Ratio 8 (L) 9 - 20   Sodium 139 134 - 144 mmol/L   Potassium 4.2 3.5 - 5.2 mmol/L   Chloride 102 96 - 106 mmol/L   CO2 22 20 - 29 mmol/L   Calcium 9.3 8.7 - 10.2 mg/dL   Total Protein 7.3 6.0 - 8.5 g/dL   Albumin 4.4 3.8 - 4.9 g/dL   Globulin, Total 2.9 1.5 - 4.5 g/dL   Albumin/Globulin Ratio 1.5 1.2 - 2.2   Bilirubin Total 0.6 0.0 - 1.2 mg/dL   Alkaline Phosphatase 167 (H) 48 - 121 IU/L   AST 43 (H) 0 - 40 IU/L   ALT 39 0 - 44 IU/L  Lipid Panel w/o Chol/HDL Ratio  Result Value Ref Range   Cholesterol,  Total 173 100 - 199 mg/dL   Triglycerides 119199 (H) 0 - 149 mg/dL   HDL 25 (L) >14>39 mg/dL   VLDL Cholesterol Cal 35 5 - 40 mg/dL   LDL Chol Calc (NIH) 782113 (H) 0 - 99 mg/dL  UA/M w/rflx Culture, Routine   Specimen: Urine   Urine  Result Value Ref Range   Specific Gravity, UA >1.030 (H) 1.005 - 1.030   pH, UA 5.0 5.0 - 7.5   Color, UA Yellow Yellow   Appearance Ur Clear Clear   Leukocytes,UA Negative Negative   Protein,UA Negative Negative/Trace   Glucose, UA Negative Negative   Ketones, UA Negative Negative   RBC, UA Negative Negative   Bilirubin, UA Negative Negative   Urobilinogen, Ur 0.2 0.2 - 1.0 mg/dL   Nitrite, UA Negative Negative  Uric acid  Result Value Ref Range   Uric Acid 8.0 3.8 - 8.4 mg/dL  PSA  Result Value Ref Range   Prostate Specific Ag, Serum 0.9 0.0 - 4.0 ng/mL      Assessment & Plan:   Problem  List Items Addressed This Visit      Respiratory   COPD (chronic obstructive pulmonary disease) (HCC)    Per spirometry results today. Start anoro, prn albuterol. Inhaler use reviewed with patient. Continue working toward smoking cessation goals.       Relevant Medications   albuterol (VENTOLIN HFA) 108 (90 Base) MCG/ACT inhaler   umeclidinium-vilanterol (ANORO ELLIPTA) 62.5-25 MCG/INH AEPB     Other   Gout - Primary    Stable, continue diet modifications, try tart cherry supplements, indomethacin prn. Recheck uric acid      Relevant Orders   Uric acid (Completed)   Cigarette smoker    NRT, habit replacement counseling reviewed      Obesity    Discussed diet and exercise changes. Congratulated his success thus far      Chronic low back pain    Follwed by Orthopedics, continue pain control per their recommendations      Relevant Medications   indomethacin (INDOCIN) 50 MG capsule    Other Visit Diagnoses    Annual physical exam       Relevant Orders   CBC with Differential/Platelet (Completed)   Comprehensive metabolic panel (Completed)   UA/M w/rflx Culture, Routine (Completed)   SOB (shortness of breath)       Relevant Medications   albuterol (PROVENTIL) (2.5 MG/3ML) 0.083% nebulizer solution 2.5 mg (Completed)   Other Relevant Orders   Spirometry with graph (Completed)   Colon cancer screening       Relevant Orders   Ambulatory referral to Gastroenterology   Screening for prostate cancer       Relevant Orders   PSA (Completed)   Need for diphtheria-tetanus-pertussis (Tdap) vaccine       Relevant Orders   Tdap vaccine greater than or equal to 7yo IM (Completed)   Need for hepatitis C screening test       Relevant Orders   Hepatitis C antibody (Completed)   Encounter for screening for HIV       Relevant Orders   HIV Antibody (routine testing w rflx) (Completed)   Screening for cardiovascular condition       Relevant Orders   Lipid Panel w/o Chol/HDL Ratio  (Completed)       Discussed aspirin prophylaxis for myocardial infarction prevention and decision was it was not indicated  LABORATORY TESTING:  Health maintenance labs ordered today as discussed above.  The natural history of prostate cancer and ongoing controversy regarding screening and potential treatment outcomes of prostate cancer has been discussed with the patient. The meaning of a false positive PSA and a false negative PSA has been discussed. He indicates understanding of the limitations of this screening test and wishes to proceed with screening PSA testing.   IMMUNIZATIONS:   - Tdap: Tetanus vaccination status reviewed: Td vaccination indicated and given today. - Influenza: Up to date - Pneumovax: Not applicable - Prevnar: Not applicable - HPV: Not applicable - Zostavax vaccine: Not applicable  SCREENING: - Colonoscopy: Ordered today  Discussed with patient purpose of the colonoscopy is to detect colon cancer at curable precancerous or early stages   -Spirometry: Ordered today   PATIENT COUNSELING:    Sexuality: Discussed sexually transmitted diseases, partner selection, use of condoms, avoidance of unintended pregnancy  and contraceptive alternatives.   Advised to avoid cigarette smoking.  I discussed with the patient that most people either abstain from alcohol or drink within safe limits (<=14/week and <=4 drinks/occasion for males, <=7/weeks and <= 3 drinks/occasion for females) and that the risk for alcohol disorders and other health effects rises proportionally with the number of drinks per week and how often a drinker exceeds daily limits.  Discussed cessation/primary prevention of drug use and availability of treatment for abuse.   Diet: Encouraged to adjust caloric intake to maintain  or achieve ideal body weight, to reduce intake of dietary saturated fat and total fat, to limit sodium intake by avoiding high sodium foods and not adding table salt, and to  maintain adequate dietary potassium and calcium preferably from fresh fruits, vegetables, and low-fat dairy products.    stressed the importance of regular exercise  Injury prevention: Discussed safety belts, safety helmets, smoke detector, smoking near bedding or upholstery.   Dental health: Discussed importance of regular tooth brushing, flossing, and dental visits.   Follow up plan: NEXT PREVENTATIVE PHYSICAL DUE IN 1 YEAR. Return in about 6 weeks (around 06/19/2020) for SOB, weight f/u.

## 2020-05-09 LAB — COMPREHENSIVE METABOLIC PANEL
ALT: 39 IU/L (ref 0–44)
AST: 43 IU/L — ABNORMAL HIGH (ref 0–40)
Albumin/Globulin Ratio: 1.5 (ref 1.2–2.2)
Albumin: 4.4 g/dL (ref 3.8–4.9)
Alkaline Phosphatase: 167 IU/L — ABNORMAL HIGH (ref 48–121)
BUN/Creatinine Ratio: 8 — ABNORMAL LOW (ref 9–20)
BUN: 9 mg/dL (ref 6–24)
Bilirubin Total: 0.6 mg/dL (ref 0.0–1.2)
CO2: 22 mmol/L (ref 20–29)
Calcium: 9.3 mg/dL (ref 8.7–10.2)
Chloride: 102 mmol/L (ref 96–106)
Creatinine, Ser: 1.06 mg/dL (ref 0.76–1.27)
GFR calc Af Amer: 93 mL/min/{1.73_m2} (ref >59)
GFR calc non Af Amer: 80 mL/min/{1.73_m2} (ref >59)
Globulin, Total: 2.9 g/dL (ref 1.5–4.5)
Glucose: 104 mg/dL — ABNORMAL HIGH (ref 65–99)
Potassium: 4.2 mmol/L (ref 3.5–5.2)
Sodium: 139 mmol/L (ref 134–144)
Total Protein: 7.3 g/dL (ref 6.0–8.5)

## 2020-05-09 LAB — CBC WITH DIFFERENTIAL/PLATELET
Basophils Absolute: 0.1 10*3/uL (ref 0.0–0.2)
Basos: 1 %
EOS (ABSOLUTE): 0.2 10*3/uL (ref 0.0–0.4)
Eos: 3 %
Hematocrit: 45.8 % (ref 37.5–51.0)
Hemoglobin: 15.7 g/dL (ref 13.0–17.7)
Immature Grans (Abs): 0 10*3/uL (ref 0.0–0.1)
Immature Granulocytes: 1 %
Lymphocytes Absolute: 2.1 10*3/uL (ref 0.7–3.1)
Lymphs: 29 %
MCH: 29.1 pg (ref 26.6–33.0)
MCHC: 34.3 g/dL (ref 31.5–35.7)
MCV: 85 fL (ref 79–97)
Monocytes Absolute: 0.4 10*3/uL (ref 0.1–0.9)
Monocytes: 5 %
Neutrophils Absolute: 4.4 10*3/uL (ref 1.4–7.0)
Neutrophils: 61 %
Platelets: 204 10*3/uL (ref 150–450)
RBC: 5.39 x10E6/uL (ref 4.14–5.80)
RDW: 12.7 % (ref 11.6–15.4)
WBC: 7.1 10*3/uL (ref 3.4–10.8)

## 2020-05-09 LAB — PSA: Prostate Specific Ag, Serum: 0.9 ng/mL (ref 0.0–4.0)

## 2020-05-09 LAB — HIV ANTIBODY (ROUTINE TESTING W REFLEX): HIV Screen 4th Generation wRfx: NONREACTIVE

## 2020-05-09 LAB — LIPID PANEL W/O CHOL/HDL RATIO
Cholesterol, Total: 173 mg/dL (ref 100–199)
HDL: 25 mg/dL — ABNORMAL LOW
LDL Chol Calc (NIH): 113 mg/dL — ABNORMAL HIGH (ref 0–99)
Triglycerides: 199 mg/dL — ABNORMAL HIGH (ref 0–149)
VLDL Cholesterol Cal: 35 mg/dL (ref 5–40)

## 2020-05-09 LAB — HEPATITIS C ANTIBODY: Hep C Virus Ab: <0.1 s/co ratio (ref 0.0–0.9)

## 2020-05-09 LAB — URIC ACID: Uric Acid: 8 mg/dL (ref 3.8–8.4)

## 2020-05-11 DIAGNOSIS — J449 Chronic obstructive pulmonary disease, unspecified: Secondary | ICD-10-CM | POA: Insufficient documentation

## 2020-05-11 NOTE — Assessment & Plan Note (Signed)
Discussed diet and exercise changes. Congratulated his success thus far

## 2020-05-11 NOTE — Assessment & Plan Note (Signed)
Per spirometry results today. Start anoro, prn albuterol. Inhaler use reviewed with patient. Continue working toward smoking cessation goals.

## 2020-05-11 NOTE — Assessment & Plan Note (Signed)
NRT, habit replacement counseling reviewed

## 2020-05-11 NOTE — Assessment & Plan Note (Signed)
Stable, continue diet modifications, try tart cherry supplements, indomethacin prn. Recheck uric acid

## 2020-05-11 NOTE — Assessment & Plan Note (Signed)
Follwed by Orthopedics, continue pain control per their recommendations

## 2020-05-12 ENCOUNTER — Telehealth: Payer: Self-pay

## 2020-05-12 NOTE — Telephone Encounter (Signed)
Patient notified of form ready for pick up 

## 2020-05-19 ENCOUNTER — Telehealth (INDEPENDENT_AMBULATORY_CARE_PROVIDER_SITE_OTHER): Payer: Self-pay | Admitting: Gastroenterology

## 2020-05-19 ENCOUNTER — Other Ambulatory Visit (INDEPENDENT_AMBULATORY_CARE_PROVIDER_SITE_OTHER): Payer: Self-pay

## 2020-05-19 DIAGNOSIS — Z1211 Encounter for screening for malignant neoplasm of colon: Secondary | ICD-10-CM

## 2020-05-19 NOTE — Progress Notes (Signed)
Gastroenterology Pre-Procedure Review  Request Date: Monday 06/30/20 Requesting Physician: Dr. Allegra Lai  PATIENT REVIEW QUESTIONS: The patient responded to the following health history questions as indicated:    1. Are you having any GI issues? no 2. Do you have a personal history of Polyps? no 3. Do you have a family history of Colon Cancer or Polyps? no 4. Diabetes Mellitus? no 5. Joint replacements in the past 12 months?no 6. Major health problems in the past 3 months?no 7. Any artificial heart valves, MVP, or defibrillator?no    MEDICATIONS & ALLERGIES:    Patient reports the following regarding taking any anticoagulation/antiplatelet therapy:   Plavix, Coumadin, Eliquis, Xarelto, Lovenox, Pradaxa, Brilinta, or Effient? no Aspirin? no  Patient confirms/reports the following medications:  Current Outpatient Medications  Medication Sig Dispense Refill  . albuterol (VENTOLIN HFA) 108 (90 Base) MCG/ACT inhaler Inhale 2 puffs into the lungs every 6 (six) hours as needed for wheezing or shortness of breath. 18 g 2  . buPROPion (WELLBUTRIN XL) 300 MG 24 hr tablet Take 1 tablet (300 mg total) by mouth daily. 90 tablet 0  . indomethacin (INDOCIN) 50 MG capsule Take 1 capsule (50 mg total) by mouth 2 (two) times daily as needed. For gout flares 60 capsule 0  . oxyCODONE-acetaminophen (PERCOCET) 7.5-325 MG tablet Take 1 tablet by mouth 3 (three) times daily.    Marland Kitchen umeclidinium-vilanterol (ANORO ELLIPTA) 62.5-25 MCG/INH AEPB Inhale 1 puff into the lungs daily. 60 each 2   No current facility-administered medications for this visit.    Patient confirms/reports the following allergies:  Allergies  Allergen Reactions  . Chantix [Varenicline] Other (See Comments)    Lip swelling    No orders of the defined types were placed in this encounter.   AUTHORIZATION INFORMATION Primary Insurance: 1D#: Group #:  Secondary Insurance: 1D#: Group #:  SCHEDULE INFORMATION: Date:  09/13/21Time: Location:[facility]

## 2020-05-23 ENCOUNTER — Other Ambulatory Visit: Payer: Self-pay | Admitting: Family Medicine

## 2020-06-19 ENCOUNTER — Ambulatory Visit: Payer: Self-pay | Admitting: Unknown Physician Specialty

## 2020-06-19 ENCOUNTER — Ambulatory Visit: Payer: Self-pay | Admitting: Family Medicine

## 2020-06-26 ENCOUNTER — Other Ambulatory Visit: Payer: Self-pay

## 2020-06-26 ENCOUNTER — Other Ambulatory Visit
Admission: RE | Admit: 2020-06-26 | Discharge: 2020-06-26 | Disposition: A | Discharge status: Home / Self Care | Payer: Managed Care, Other (non HMO) | Source: Ambulatory Visit | Attending: Gastroenterology | Admitting: Gastroenterology

## 2020-06-26 DIAGNOSIS — Z01812 Encounter for preprocedural laboratory examination: Secondary | ICD-10-CM | POA: Insufficient documentation

## 2020-06-26 DIAGNOSIS — Z20822 Contact with and (suspected) exposure to covid-19: Secondary | ICD-10-CM | POA: Diagnosis not present

## 2020-06-27 ENCOUNTER — Telehealth: Payer: Self-pay | Admitting: Family Medicine

## 2020-06-27 LAB — SARS CORONAVIRUS 2 (TAT 6-24 HRS): SARS Coronavirus 2: NEGATIVE

## 2020-06-27 NOTE — Telephone Encounter (Signed)
Copied from CRM [id number]. Topic: General - Inquiry >> Jun 27, 2020 12:44 PM Walter Mcclain wrote: Reason for CRM: Patient is at the pharmacy to pick up his medication for his colonoscopy and it's nothing at the pharmacy and his appointment is Monday. He wants you to send the medication to the pharmacy and he is going to sit there and wait, because he doesn't want to come back to the pharmacy. Please advise.

## 2020-06-27 NOTE — Telephone Encounter (Signed)
Pt was given dr Allegra Lai GI specialist phone number to obtain rx for his colonoscopy

## 2020-06-27 NOTE — Telephone Encounter (Signed)
Tried calling patient, no answer and VM box was full. We do not write this medication. Patient needs to contact the GI office. Will try to call back later.

## 2020-06-30 ENCOUNTER — Ambulatory Visit: Payer: Managed Care, Other (non HMO) | Admitting: Anesthesiology

## 2020-06-30 ENCOUNTER — Other Ambulatory Visit: Payer: Self-pay

## 2020-06-30 ENCOUNTER — Ambulatory Visit
Admission: RE | Admit: 2020-06-30 | Discharge: 2020-06-30 | Disposition: A | Discharge status: Home / Self Care | Payer: Managed Care, Other (non HMO) | Attending: Gastroenterology | Admitting: Gastroenterology

## 2020-06-30 ENCOUNTER — Encounter: Payer: Self-pay | Admitting: Gastroenterology

## 2020-06-30 ENCOUNTER — Encounter
Admission: RE | Disposition: A | Discharge status: Home / Self Care | Payer: Self-pay | Source: Home / Self Care | Attending: Gastroenterology

## 2020-06-30 DIAGNOSIS — K635 Polyp of colon: Secondary | ICD-10-CM | POA: Insufficient documentation

## 2020-06-30 DIAGNOSIS — K644 Residual hemorrhoidal skin tags: Secondary | ICD-10-CM | POA: Diagnosis not present

## 2020-06-30 DIAGNOSIS — J449 Chronic obstructive pulmonary disease, unspecified: Secondary | ICD-10-CM | POA: Diagnosis not present

## 2020-06-30 DIAGNOSIS — Z1211 Encounter for screening for malignant neoplasm of colon: Secondary | ICD-10-CM

## 2020-06-30 DIAGNOSIS — Z79899 Other long term (current) drug therapy: Secondary | ICD-10-CM | POA: Diagnosis not present

## 2020-06-30 DIAGNOSIS — Z87891 Personal history of nicotine dependence: Secondary | ICD-10-CM | POA: Diagnosis not present

## 2020-06-30 HISTORY — PX: COLONOSCOPY WITH PROPOFOL: SHX5780

## 2020-06-30 HISTORY — DX: Chronic obstructive pulmonary disease, unspecified: J44.9

## 2020-06-30 SURGERY — COLONOSCOPY WITH PROPOFOL
Anesthesia: General

## 2020-06-30 MED ORDER — SODIUM CHLORIDE 0.9 % IV SOLN: INTRAVENOUS | Status: DC

## 2020-06-30 MED ORDER — PROPOFOL 500 MG/50ML IV EMUL
INTRAVENOUS | Status: DC | PRN
  Administered 2020-06-30: 150 ug/kg/min via INTRAVENOUS

## 2020-06-30 MED ORDER — PROPOFOL 10 MG/ML IV BOLUS
INTRAVENOUS | Status: DC | PRN
  Administered 2020-06-30: 50 mg via INTRAVENOUS

## 2020-06-30 MED ORDER — LIDOCAINE HCL (PF) 1 % IJ SOLN
INTRAMUSCULAR | Status: AC
  Filled 2020-06-30: qty 2

## 2020-06-30 MED ORDER — PROPOFOL 500 MG/50ML IV EMUL
INTRAVENOUS | Status: AC
  Filled 2020-06-30: qty 50

## 2020-06-30 MED ORDER — LIDOCAINE HCL (CARDIAC) PF 100 MG/5ML IV SOSY
PREFILLED_SYRINGE | INTRAVENOUS | Status: DC | PRN
  Administered 2020-06-30: 100 mg via INTRAVENOUS

## 2020-06-30 NOTE — Anesthesia Procedure Notes (Signed)
Date/Time: 06/30/2020 11:05 AM Performed by: Henrietta Hoover, CRNA Pre-anesthesia Checklist: Patient identified, Emergency Drugs available, Suction available, Patient being monitored and Timeout performed Patient Re-evaluated:Patient Re-evaluated prior to induction Oxygen Delivery Method: Nasal cannula Placement Confirmation: positive ETCO2 Comments: Right nasal trumpet lubricated with 4% lidocaine jelly and placed with ease in right nare @ 1110

## 2020-06-30 NOTE — Transfer of Care (Signed)
Immediate Anesthesia Transfer of Care Note  Patient: Walter Mcclain.  Procedure(s) Performed: COLONOSCOPY WITH PROPOFOL (N/A )  Patient Location: PACU  Anesthesia Type:General  Level of Consciousness: sedated  Airway & Oxygen Therapy: Patient Spontanous Breathing and Patient connected to nasal cannula oxygen  Post-op Assessment: Report given to RN and Post -op Vital signs reviewed and stable  Post vital signs: Reviewed and stable  Last Vitals:  Vitals Value Taken Time  BP    Temp    Pulse 76 06/30/20 1128  Resp 22 06/30/20 1128  SpO2 97 % 06/30/20 1128  Vitals shown include unvalidated device data.  Last Pain:  Vitals:   06/30/20 1126  TempSrc: Tympanic  PainSc: Asleep         Complications: No complications documented.

## 2020-06-30 NOTE — Anesthesia Postprocedure Evaluation (Signed)
Anesthesia Post Note  Patient: Walter Mcclain.  Procedure(s) Performed: COLONOSCOPY WITH PROPOFOL (N/A )  Patient location during evaluation: Endoscopy Anesthesia Type: General Level of consciousness: awake and alert Pain management: pain level controlled Vital Signs Assessment: post-procedure vital signs reviewed and stable Respiratory status: spontaneous breathing, nonlabored ventilation, respiratory function stable and patient connected to nasal cannula oxygen Cardiovascular status: blood pressure returned to baseline and stable Postop Assessment: no apparent nausea or vomiting Anesthetic complications: no   No complications documented.   Last Vitals:  Vitals:   06/30/20 1136 06/30/20 1146  BP: 102/75 112/82  Pulse: 69 70  Resp: 14 12  Temp:    SpO2: 98% 97%    Last Pain:  Vitals:   06/30/20 1146  TempSrc:   PainSc: 0-No pain                 Corinda Gubler

## 2020-06-30 NOTE — Op Note (Signed)
[facility] Gastroenterology Patient Name: Walter Mcclain Procedure Date: 06/30/2020 10:49 AM MRN: [id number] Account #: [id number] Date of Birth: June 13, 1966 Admit Type: Outpatient Age: 54 Room: [facility] ENDO ROOM 2 Gender: Male Note Status: Finalized Procedure:             Colonoscopy Indications:           Screening for colorectal malignant neoplasm, This is                         the patient's first colonoscopy Providers:             Lin Landsman MD, MD Medicines:             Monitored Anesthesia Care Complications:         No immediate complications. Estimated blood loss: None. Procedure:             Pre-Anesthesia Assessment:                        - Prior to the procedure, a History and Physical was                         performed, and patient medications and allergies were                         reviewed. The patient is competent. The risks and                         benefits of the procedure and the sedation options and                         risks were discussed with the patient. All questions                         were answered and informed consent was obtained.                         Patient identification and proposed procedure were                         verified by the physician, the nurse, the                         anesthesiologist, the anesthetist and the technician                         in the pre-procedure area in the procedure room in the                         endoscopy suite. Mental Status Examination: alert and                         oriented. Airway Examination: normal oropharyngeal                         airway and neck mobility. Respiratory Examination:                         clear to auscultation. CV Examination: normal.  Prophylactic Antibiotics: The patient does not require                         prophylactic antibiotics. Prior Anticoagulants: The                         patient has taken no  previous anticoagulant or                         antiplatelet agents. ASA Grade Assessment: II - A                         patient with mild systemic disease. After reviewing                         the risks and benefits, the patient was deemed in                         satisfactory condition to undergo the procedure. The                         anesthesia plan was to use monitored anesthesia care                         (MAC). Immediately prior to administration of                         medications, the patient was re-assessed for adequacy                         to receive sedatives. The heart rate, respiratory                         rate, oxygen saturations, blood pressure, adequacy of                         pulmonary ventilation, and response to care were                         monitored throughout the procedure. The physical                         status of the patient was re-assessed after the                         procedure.                        After obtaining informed consent, the colonoscope was                         passed under direct vision. Throughout the procedure,                         the patient's blood pressure, pulse, and oxygen                         saturations were monitored continuously. The  Colonoscope was introduced through the anus and                         advanced to the the cecum, identified by appendiceal                         orifice and ileocecal valve. The colonoscopy was                         performed without difficulty. The patient tolerated                         the procedure well. The quality of the bowel                         preparation was evaluated using the BBPS [facility] Bowel                         Preparation Scale) with scores of: Right Colon = 3,                         Transverse Colon = 3 and Left Colon = 3 (entire mucosa                         seen well with no residual staining, small  fragments                         of stool or opaque liquid). The total BBPS score                         equals 9. Findings:      The perianal and digital rectal examinations were normal. Pertinent       negatives include normal sphincter tone and no palpable rectal lesions.      Two sessile polyps were found in the sigmoid colon and descending colon.       The polyps were 5 mm in size. These polyps were removed with a cold       snare. Resection and retrieval were complete.      Non-bleeding external hemorrhoids were found during retroflexion. The       hemorrhoids were medium-sized.      The exam was otherwise without abnormality. Impression:            - Two 5 mm polyps in the sigmoid colon and in the                         descending colon, removed with a cold snare. Resected                         and retrieved.                        - Non-bleeding external hemorrhoids.                        - The examination was otherwise normal. Recommendation:        - Discharge patient to home (with escort).                        -  Resume previous diet today.                        - Continue present medications.                        - Await pathology results.                        - Repeat colonoscopy in 7 years for surveillance based                         on pathology results. Procedure Code(s):     --- Professional ---                        [phone], Colonoscopy, flexible; with removal of                         tumor(s), polyp(s), or other lesion(s) by snare                         technique Diagnosis Code(s):     --- Professional ---                        Z12.11, Encounter for screening for malignant neoplasm                         of colon                        K63.5, Polyp of colon                        K64.4, Residual hemorrhoidal skin tags CPT copyright 2019 American Medical Association. All rights reserved. The codes documented in this report are preliminary and upon  coder review may  be revised to meet current compliance requirements. Dr. Ulyess Mort Lin Landsman MD, MD 06/30/2020 11:18:07 AM This report has been signed electronically. Number of Addenda: 0 Note Initiated On: 06/30/2020 10:49 AM Scope Withdrawal Time: 0 hours 13 minutes 5 seconds  Total Procedure Duration: 0 hours 14 minutes 33 seconds  Estimated Blood Loss:  Estimated blood loss: none.      [facility]

## 2020-06-30 NOTE — Anesthesia Preprocedure Evaluation (Signed)
Anesthesia Evaluation  Patient identified by MRN, date of birth, ID band Patient awake    Reviewed: Allergy & Precautions, NPO status , Patient's Chart, lab work & pertinent test results  History of Anesthesia Complications Negative for: history of anesthetic complications  Airway Mallampati: II  TM Distance: >3 FB Neck ROM: Full    Dental  (+) Upper Dentures, Lower Dentures   Pulmonary neg sleep apnea, COPD,  COPD inhaler, Patient abstained from smoking.Not current smoker, former smoker,    Pulmonary exam normal breath sounds clear to auscultation       Cardiovascular Exercise Tolerance: Good METS(-) hypertension(-) CAD and (-) Past MI negative cardio ROS  (-) dysrhythmias  Rhythm:Regular Rate:Normal - Systolic murmurs    Neuro/Psych On percocet for chronic pain  Neuromuscular disease negative psych ROS   GI/Hepatic neg GERD  ,(+)     (-) substance abuse  ,   Endo/Other  neg diabetes  Renal/GU negative Renal ROS     Musculoskeletal   Abdominal   Peds  Hematology   Anesthesia Other Findings Past Medical History: No date: COPD (chronic obstructive pulmonary disease) (HCC)  Reproductive/Obstetrics                             Anesthesia Physical Anesthesia Plan  ASA: II  Anesthesia Plan: General   Post-op Pain Management:    Induction: Intravenous  PONV Risk Score and Plan: 2 and Ondansetron, Propofol infusion and TIVA  Airway Management Planned: Nasal Cannula  Additional Equipment: None  Intra-op Plan:   Post-operative Plan:   Informed Consent: I have reviewed the patients History and Physical, chart, labs and discussed the procedure including the risks, benefits and alternatives for the proposed anesthesia with the patient or authorized representative who has indicated his/her understanding and acceptance.     Dental advisory given  Plan Discussed with: CRNA and  Surgeon  Anesthesia Plan Comments: (Discussed risks of anesthesia with patient, including possibility of difficulty with spontaneous ventilation under anesthesia necessitating airway intervention, PONV, and rare risks such as cardiac or respiratory or neurological events. Patient understands.)        Anesthesia Quick Evaluation

## 2020-06-30 NOTE — H&P (Signed)
Arlyss Repress, MD [address]  [address]  [place], [place] [address]  Main: [phone]  Fax: [phone] Pager: [phone]  Primary Care Physician:  Particia Nearing, PA-C Primary Gastroenterologist:  Dr. Arlyss Repress  Pre-Procedure History & Physical: HPI:  Arn Mcomber. is a 54 y.o. male is here for an colonoscopy.   Past Medical History:  Diagnosis Date  . COPD (chronic obstructive pulmonary disease) (HCC)     Past Surgical History:  Procedure Laterality Date  . ELBOW SURGERY Right     Prior to Admission medications   Medication Sig Start Date End Date Taking? Authorizing Provider  albuterol (VENTOLIN HFA) 108 (90 Base) MCG/ACT inhaler Inhale 2 puffs into the lungs every 6 (six) hours as needed for wheezing or shortness of breath. 05/08/20  Yes Particia Nearing, PA-C  buPROPion (WELLBUTRIN XL) 300 MG 24 hr tablet Take 1 tablet (300 mg total) by mouth daily. 05/08/20  Yes Particia Nearing, PA-C  umeclidinium-vilanterol (ANORO ELLIPTA) 62.5-25 MCG/INH AEPB Inhale 1 puff into the lungs daily. 05/08/20  Yes Particia Nearing, PA-C  indomethacin (INDOCIN) 50 MG capsule TAKE 1 CAPSULE (50 MG TOTAL) BY MOUTH 2 (TWO) TIMES DAILY AS NEEDED. FOR GOUT FLARES 05/23/20   Particia Nearing, PA-C  oxyCODONE-acetaminophen (PERCOCET) 7.5-325 MG tablet Take 1 tablet by mouth 3 (three) times daily.    [provider]    Allergies as of 05/19/2020 - Review Complete 05/19/2020  Allergen Reaction Noted  . Chantix [varenicline] Other (See Comments) 04/01/2020    Family History  Problem Relation Age of Onset  . Brain cancer Mother   . Liver disease Father   . Heart disease Maternal Grandmother   . Liver disease Paternal Grandmother   . Other Paternal Grandfather        MVA    Social History   Socioeconomic History  . Marital status: Married    Spouse name: Not on file  . Number of children: Not on file  . Years of education: Not  on file  . Highest education level: Not on file  Occupational History  . Not on file  Tobacco Use  . Smoking status: Former Smoker    Packs/day: 1.00    Types: Cigarettes    Quit date: 05/18/2020    Years since quitting: 0.1  . Smokeless tobacco: Never Used  Vaping Use  . Vaping Use: Never used  Substance and Sexual Activity  . Alcohol use: No  . Drug use: Not Currently    Types: Marijuana    Comment: Patient states he does a "Gummy" occasionally  . Sexual activity: Not on file  Other Topics Concern  . Not on file  Social History Narrative  . Not on file   Social Determinants of Health   Financial Resource Strain:   . Difficulty of Paying Living Expenses: Not on file  Food Insecurity:   . Worried About Programme researcher, broadcasting/film/video in the Last Year: Not on file  . Ran Out of Food in the Last Year: Not on file  Transportation Needs:   . Lack of Transportation (Medical): Not on file  . Lack of Transportation (Non-Medical): Not on file  Physical Activity:   . Days of Exercise per Week: Not on file  . Minutes of Exercise per Session: Not on file  Stress:   . Feeling of Stress : Not on file  Social Connections:   . Frequency of Communication with Friends and Family: Not  on file  . Frequency of Social Gatherings with Friends and Family: Not on file  . Attends Religious Services: Not on file  . Active Member of Clubs or Organizations: Not on file  . Attends Banker Meetings: Not on file  . Marital Status: Not on file  Intimate Partner Violence:   . Fear of Current or Ex-Partner: Not on file  . Emotionally Abused: Not on file  . Physically Abused: Not on file  . Sexually Abused: Not on file    Review of Systems: See HPI, otherwise negative ROS  Physical Exam: BP 119/80   Pulse 79   Temp 98.2 F (36.8 C) (Temporal)   Resp (!) 22   Ht 5\' 11"  (1.803 m)   Wt (!) 142.9 kg   SpO2 98%   BMI 43.93 kg/m  General:   Alert,  pleasant and cooperative in NAD Head:   Normocephalic and atraumatic. Neck:  Supple; no masses or thyromegaly. Lungs:  Clear throughout to auscultation.    Heart:  Regular rate and rhythm. Abdomen:  Soft, nontender and nondistended. Normal bowel sounds, without guarding, and without rebound.   Neurologic:  Alert and  oriented x4;  grossly normal neurologically.  Impression/Plan: . is here for an colonoscopy to be performed for colon cancer screening  Risks, benefits, limitations, and alternatives regarding  colonoscopy have been reviewed with the patient.  Questions have been answered.  All parties agreeable.   [name], MD  06/30/2020, 10:46 AM

## 2020-07-01 ENCOUNTER — Encounter: Payer: Self-pay | Admitting: Gastroenterology

## 2020-07-01 LAB — SURGICAL PATHOLOGY

## 2020-07-10 ENCOUNTER — Other Ambulatory Visit: Payer: Self-pay

## 2020-07-10 ENCOUNTER — Encounter: Payer: Self-pay | Admitting: Unknown Physician Specialty

## 2020-07-10 ENCOUNTER — Ambulatory Visit (INDEPENDENT_AMBULATORY_CARE_PROVIDER_SITE_OTHER): Payer: Managed Care, Other (non HMO) | Admitting: Unknown Physician Specialty

## 2020-07-10 VITALS — BP 136/85 | HR 77 | Temp 97.8°F | Resp 16 | Ht 71.0 in | Wt 325.0 lb

## 2020-07-10 DIAGNOSIS — L97911 Non-pressure chronic ulcer of unspecified part of right lower leg limited to breakdown of skin: Secondary | ICD-10-CM

## 2020-07-10 DIAGNOSIS — J449 Chronic obstructive pulmonary disease, unspecified: Secondary | ICD-10-CM

## 2020-07-10 DIAGNOSIS — L97921 Non-pressure chronic ulcer of unspecified part of left lower leg limited to breakdown of skin: Secondary | ICD-10-CM

## 2020-07-10 MED ORDER — MUPIROCIN 2 % EX OINT: 1.0000 "application " | TOPICAL_OINTMENT | Freq: Two times a day (BID) | CUTANEOUS | 0 refills | Status: DC

## 2020-07-10 NOTE — Assessment & Plan Note (Addendum)
Grade 1 after review of  Spirometry.  Stopped smoking and SOB is improved.

## 2020-07-10 NOTE — Progress Notes (Addendum)
BP 136/85   Pulse 77   Temp 97.8 F (36.6 C) (Temporal)   Resp 16   Ht 5\' 11"  (1.803 m)   Wt (!) 325 lb (147.4 kg)   BMI 45.33 kg/m    Subjective:    Patient ID: ., male    DOB: 1965-11-11, 54 y.o.   MRN: [id number]  HPI: Walter Combes. is a 54 y.o. male  Chief Complaint  Patient presents with  . Hypertension    btw went to walk in clinic in [facility] and diagnosed with MRSA --Doxycycline given asking for more Rx   Pt went to the walk in clinic due to skin lesions.  He got   SOB F/u today for SOB.  He was given Anoro and Albuterol.  However quit smoking and is doing better. Never started Anoro or Albuterol.  His weight has done up but trying to "tackle one thing at a time"   Skin lesions Diagnosed at [facility] for boils.  Not sure if it is MRSA but son is a carrier.  Finishing antibiotics but lesions persistent and wonders if he needs more antibiotics. This is the first incidence of boils   Relevant past medical, surgical, family and social history reviewed and updated as indicated. Interim medical history since our last visit reviewed. Allergies and medications reviewed and updated.  Review of Systems  Constitutional: Negative.   HENT: Negative.   Respiratory: Negative for shortness of breath.   Gastrointestinal: Negative.   Psychiatric/Behavioral: Negative.     Per HPI unless specifically indicated above     Objective:    BP 136/85   Pulse 77   Temp 97.8 F (36.6 C) (Temporal)   Resp 16   Ht 5\' 11"  (1.803 m)   Wt (!) 325 lb (147.4 kg)   BMI 45.33 kg/m   Wt Readings from Last 3 Encounters:  07/10/20 (!) 325 lb (147.4 kg)  06/30/20 (!) 315 lb (142.9 kg)  05/08/20 (!) 319 lb (144.7 kg)    Physical Exam Constitutional:      General: He is not in acute distress.    Appearance: Normal appearance. He is well-developed. He is obese.  HENT:     Head: Normocephalic and atraumatic.  Eyes:     General: Lids are normal. No scleral  icterus.       Right eye: No discharge.        Left eye: No discharge.     Conjunctiva/sclera: Conjunctivae normal.  Cardiovascular:     Rate and Rhythm: Normal rate.  Pulmonary:     Effort: Pulmonary effort is normal.  Abdominal:     Palpations: There is no hepatomegaly or splenomegaly.  Musculoskeletal:        General: Normal range of motion.  Skin:    Coloration: Skin is not pale.     Findings: No rash.     Comments: One dime sized ulcerated lesion on right and one on left forearm.  These are open with partial granulation  Neurological:     Mental Status: He is alert and oriented to person, place, and time.  Psychiatric:        Behavior: Behavior normal.        Thought Content: Thought content normal.        Judgment: Judgment normal.      Assessment & Plan:   Problem List Items Addressed This Visit      Unprioritized   COPD (chronic obstructive pulmonary disease) (HCC)  Grade 1 after review of  Spirometry.  Stopped smoking and SOB is improved.        Other Visit Diagnoses    healing ulcers upper extr    -  Primary   Pt ed on MRSA.  Keep lesions covered with non stick Tefla and Bactroban.  Dressings applied in office.        Covid immunizations complete 7/27 and 8/17  Follow up plan: Return if symptoms worsen or fail to improve.

## 2020-07-22 ENCOUNTER — Telehealth: Payer: Self-pay

## 2020-07-22 NOTE — Telephone Encounter (Signed)
PT came to drop off [employer] wellness screening appeal form. Placed in bin for provider to review, Pt would like a call when and if it can be signed.

## 2020-07-28 NOTE — Telephone Encounter (Signed)
Last seen by Fleet Contras for CPE in July. Will see if another provider can review and sign off for patient.

## 2020-07-30 NOTE — Telephone Encounter (Signed)
Form completed and signed by Jolene. Called and notified patient that is was ready for pick up. Copy placed in scan bin as well.

## 2020-09-25 ENCOUNTER — Ambulatory Visit: Payer: Managed Care, Other (non HMO) | Admitting: Unknown Physician Specialty

## 2020-09-25 ENCOUNTER — Encounter: Payer: Self-pay | Admitting: Unknown Physician Specialty

## 2020-09-25 ENCOUNTER — Other Ambulatory Visit: Payer: Self-pay

## 2020-09-25 DIAGNOSIS — F322 Major depressive disorder, single episode, severe without psychotic features: Secondary | ICD-10-CM | POA: Diagnosis not present

## 2020-09-25 MED ORDER — LORAZEPAM 0.5 MG PO TABS: 0.5000 mg | ORAL_TABLET | Freq: Every evening | ORAL | 0 refills | Status: DC | PRN

## 2020-09-25 MED ORDER — CITALOPRAM HYDROBROMIDE 20 MG PO TABS: 20.0000 mg | ORAL_TABLET | Freq: Every day | ORAL | 3 refills | Status: DC

## 2020-09-25 NOTE — Assessment & Plan Note (Signed)
Pt with situational anxiety and depression related to marital issues and feeling bad on disability.  Gave resources for counseling and rx for Citalopram 20 mg daily.  I've explained to her that drugs of the SSRI class can have side effects such as weight gain, sexual dysfunction, insomnia, headache, nausea. These medications are generally effective at alleviating symptoms of anxiety and/or depression. Let me know if significant side effects do occur. Limited rx given for Lorazepam QHS prn.

## 2020-09-25 NOTE — Progress Notes (Signed)
BP 138/83   Pulse 81   Temp 98.1 F (36.7 C) (Oral)   Wt (!) 302 lb (137 kg)   SpO2 93%   BMI 42.12 kg/m    Subjective:    Patient ID: Walter Carrow., male    DOB: 12/23/1965, 54 y.o.   MRN: [id number]  HPI: Walter Mcclain. is a 54 y.o. male  Chief Complaint  Patient presents with  . Depression    Pt states he has been dealing with anxiety and depression for the past month. States he has been having marriage issues with his wife, she was seeing someone else per the patient. States he gets very anxious and worries when she is not around. States he cannot sleep or eat. States he just cries all the time.    Depression screen [facility] 2/9 09/25/2020 07/10/2020 05/08/2020  Decreased Interest 3 0 2  Down, Depressed, Hopeless 3 0 0  PHQ - 2 Score 6 0 2  Altered sleeping 3 - 0  Tired, decreased energy 3 - 1  Change in appetite 3 - 0  Feeling bad or failure about yourself  3 - 0  Trouble concentrating 3 - 0  Moving slowly or fidgety/restless 0 - 0  Suicidal thoughts 2 - 0  PHQ-9 Score 23 - 3  Difficult doing work/chores Very difficult - -   GAD 7 : Generalized Anxiety Score 09/25/2020 07/10/2020 05/08/2020  Nervous, Anxious, on Edge 3 0 0  Control/stop worrying 3 0 0  Worry too much - different things 3 0 0  Trouble relaxing 3 0 0  Restless 3 0 0  Easily annoyed or irritable 3 0 1  Afraid - awful might happen 3 0 0  Total GAD 7 Score 21 0 1  Anxiety Difficulty Very difficult Not difficult at all Not difficult at all    Pt states that he is having marital concerns as noted above.  He has had this concern since Nov 9th.  Appetite is down, trying to go to the gym, trying to keep occupied.  He is currently disabled due to lower back issues.  and "sitting at home most of the time."  States he recently has removed guns from his access. Doesn't want to hurt anyone or himself.  He has never before had trouble with depression, anxiety or controlling emotions.    Relevant past  medical, surgical, family and social history reviewed and updated as indicated. Interim medical history since our last visit reviewed. Allergies and medications reviewed and updated.  Review of Systems  Per HPI unless specifically indicated above     Objective:    BP 138/83   Pulse 81   Temp 98.1 F (36.7 C) (Oral)   Wt (!) 302 lb (137 kg)   SpO2 93%   BMI 42.12 kg/m   Wt Readings from Last 3 Encounters:  09/25/20 (!) 302 lb (137 kg)  07/10/20 (!) 325 lb (147.4 kg)  06/30/20 (!) 315 lb (142.9 kg)    Physical Exam HENT:     Head: Normocephalic.  Neurological:     Mental Status: He is alert.  Psychiatric:        Attention and Perception: Attention normal.        Mood and Affect: Affect is tearful.        Speech: Speech normal.        Behavior: Behavior normal. Behavior is cooperative.        Thought Content: Thought content normal.  Cognition and Memory: Cognition and memory normal.       Assessment & Plan:   Problem List Items Addressed This Visit      Unprioritized   Depression, major, single episode, severe (HCC)    Pt with situational anxiety and depression related to marital issues and feeling bad on disability.  Gave resources for counseling and rx for Citalopram 20 mg daily.  I've explained to her that drugs of the SSRI class can have side effects such as weight gain, sexual dysfunction, insomnia, headache, nausea. These medications are generally effective at alleviating symptoms of anxiety and/or depression. Let me know if significant side effects do occur. Limited rx given for Lorazepam QHS prn.          Relevant Medications   citalopram (CELEXA) 20 MG tablet   LORazepam (ATIVAN) 0.5 MG tablet       Follow up plan: Return in about 2 weeks (around 10/09/2020).

## 2020-09-25 NOTE — Patient Instructions (Addendum)
[facility]:  Phone: [phone]  Walter Mcclain  Phone [phone]  Drugs of the SSRI such as Citalopram class can have side effects such as weight gain, sexual dysfunction, insomnia, headache, nausea. These medications are generally effective at alleviating symptoms of anxiety and/or depression. Let me know if significant side effects do occur.

## 2020-10-09 ENCOUNTER — Other Ambulatory Visit: Payer: Self-pay

## 2020-10-09 ENCOUNTER — Ambulatory Visit: Payer: Managed Care, Other (non HMO) | Admitting: Family Medicine

## 2020-10-09 ENCOUNTER — Encounter: Payer: Self-pay | Admitting: Family Medicine

## 2020-10-09 VITALS — BP 125/82 | HR 69 | Temp 98.2°F | Wt 297.0 lb

## 2020-10-09 DIAGNOSIS — F322 Major depressive disorder, single episode, severe without psychotic features: Secondary | ICD-10-CM

## 2020-10-09 NOTE — Progress Notes (Signed)
° °  SUBJECTIVE:   CHIEF COMPLAINT / HPI:   Patient Active Problem List   Diagnosis Date Noted   Depression, major, single episode, severe (HCC) 09/25/2020   Encounter for screening colonoscopy    COPD (chronic obstructive pulmonary disease) (HCC) 05/11/2020   Gout 04/01/2020   Cigarette smoker 04/01/2020   Obesity 04/01/2020   Chronic low back pain 04/01/2020   Chronic gouty arthritis 11/19/2015   Anxiety/Depression - Medications: celexa 20mg  (started 2 weeks ago), lorazepam prn - Taking: skipped one day of the celexa last week. Used lorazepam twice.  - Side Effects: dull headache, ED. Symptoms not bothersome. - Counseling: no.  - Previous hospitalizations: no - FH of psych illness: no - Symptoms: difficulties falling asleep, anhedonia, fatigue - feels improvement in symptoms.  - No SI/HI. - Current stressors: wife recently cheated - Coping Mechanisms: goes to the gym (walks, weights), being with others   Depression screen [facility] 2/9 10/09/2020 09/25/2020 07/10/2020 05/08/2020  Decreased Interest 0 3 0 2  Down, Depressed, Hopeless 1 3 0 0  PHQ - 2 Score 1 6 0 2  Altered sleeping 2 3 - 0  Tired, decreased energy 0 3 - 1  Change in appetite 0 3 - 0  Feeling bad or failure about yourself  1 3 - 0  Trouble concentrating 0 3 - 0  Moving slowly or fidgety/restless 0 0 - 0  Suicidal thoughts 0 2 - 0  PHQ-9 Score 4 23 - 3  Difficult doing work/chores - Very difficult - -   GAD 7 : Generalized Anxiety Score 10/09/2020 09/25/2020 07/10/2020 05/08/2020  Nervous, Anxious, on Edge 1 3 0 0  Control/stop worrying 0 3 0 0  Worry too much - different things 0 3 0 0  Trouble relaxing 1 3 0 0  Restless 0 3 0 0  Easily annoyed or irritable 0 3 0 1  Afraid - awful might happen 0 3 0 0  Total GAD 7 Score 2 21 0 1  Anxiety Difficulty Not difficult at all Very difficult Not difficult at all Not difficult at all     OBJECTIVE:   BP 125/82 (BP Location: Right Arm, Cuff Size: Normal)     Pulse 69    Temp 98.2 F (36.8 C) (Oral)    Wt 297 lb (134.7 kg)    SpO2 97%    BMI 41.42 kg/m   Gen: overweight, in NAD Psych: occasionally tearful, appropriately dressed and well groomed. No SI.  ASSESSMENT/PLAN:   Depression, major, single episode, severe (HCC) Symptoms improved since last visit though with some sexual side effects, reports not bothersome enough to change treatment. Will continue on current dose of celexa to reach full effect in a few weeks. Resources provided for counseling.  F/u in 2 weeks for reassessment.   05/10/2020, DO

## 2020-10-09 NOTE — Patient Instructions (Signed)
It was great to see you!  Our plans for today:  - Check out [email or web address] for counseling resources. I recommend couples counseling.  - No changes to your medications for now.  - Come back in 2 weeks for follow up.   Take care and seek immediate care sooner if you develop any concerns.   Dr. Linwood Dibbles

## 2020-10-09 NOTE — Assessment & Plan Note (Addendum)
Symptoms improved since last visit though with some sexual side effects, reports not bothersome enough to change treatment. Will continue on current dose of celexa to reach full effect in a few weeks. Resources provided for counseling.  F/u in 2 weeks for reassessment.

## 2020-10-17 ENCOUNTER — Other Ambulatory Visit: Payer: Self-pay | Admitting: Unknown Physician Specialty

## 2020-10-20 NOTE — Telephone Encounter (Signed)
Change in quantity

## 2020-10-20 NOTE — Telephone Encounter (Signed)
   Notes to clinic:  Requesting a 90 day with 2 refills Review for change   Requested Prescriptions  Pending Prescriptions Disp Refills   citalopram (CELEXA) 20 MG tablet [Pharmacy Med Name: CITALOPRAM HBR 20 MG TABLET] 90 tablet 2    Sig: TAKE 1 TABLET BY MOUTH EVERY DAY      Psychiatry:  Antidepressants - SSRI Passed - 10/17/2020 11:32 AM      Passed - Completed PHQ-2 or PHQ-9 in the last 360 days      Passed - Valid encounter within last 6 months    Recent Outpatient Visits           1 week ago Depression, major, single episode, severe (HCC)   [facility] Caro Laroche, DO   3 weeks ago Depression, major, single episode, severe (HCC)   [facility] Gabriel Cirri, NP   3 months ago healing ulcers upper extr   [facility] Gabriel Cirri, NP   5 months ago Idiopathic chronic gout of right foot without tophus   [facility], [place], [place]   6 months ago Idiopathic chronic gout of right foot without tophus   [facility], [place], [place]       Future Appointments             In 3 days Rumball, Darl Householder, DO [facility], [facility]

## 2020-10-23 ENCOUNTER — Ambulatory Visit (INDEPENDENT_AMBULATORY_CARE_PROVIDER_SITE_OTHER): Payer: Managed Care, Other (non HMO) | Admitting: Family Medicine

## 2020-10-23 ENCOUNTER — Other Ambulatory Visit: Payer: Self-pay

## 2020-10-23 ENCOUNTER — Encounter: Payer: Self-pay | Admitting: Family Medicine

## 2020-10-23 VITALS — BP 108/67 | HR 69 | Temp 97.5°F | Wt 292.0 lb

## 2020-10-23 DIAGNOSIS — F322 Major depressive disorder, single episode, severe without psychotic features: Secondary | ICD-10-CM | POA: Diagnosis not present

## 2020-10-23 MED ORDER — SERTRALINE HCL 50 MG PO TABS: 50.0000 mg | ORAL_TABLET | Freq: Every day | ORAL | 0 refills | Status: DC

## 2020-10-23 NOTE — Progress Notes (Signed)
   SUBJECTIVE:   CHIEF COMPLAINT / HPI:   Patient Active Problem List   Diagnosis Date Noted  . Depression, major, single episode, severe (HCC) 09/25/2020  . Encounter for screening colonoscopy   . COPD (chronic obstructive pulmonary disease) (HCC) 05/11/2020  . Gout 04/01/2020  . Cigarette smoker 04/01/2020  . Obesity 04/01/2020  . Chronic low back pain 04/01/2020  . Chronic gouty arthritis 11/19/2015   Depression - Medications: celexa 20mg , ativan 0.5mg  qhs prn - Taking: hasn't used ativan since last visit. - Side Effects: sexual dysfunction - Counseling: no - Previous hospitalizations: no - FH of psych illness: no - Symptoms: worse around bedtime, difficulty falling and staying asleep, mind racing - no bedtime routine, doesn't nap during the day. - feels better after exercising - had one meltdown last night - Current stressors: marital stress - Coping Mechanisms: goes to the gym (walks, weights), being with others  Depression screen [facility] 2/9 10/23/2020 10/09/2020 09/25/2020  Decreased Interest 0 0 3  Down, Depressed, Hopeless 1 1 3   PHQ - 2 Score 1 1 6   Altered sleeping 2 2 3   Tired, decreased energy 0 0 3  Change in appetite 1 0 3  Feeling bad or failure about yourself  0 1 3  Trouble concentrating 0 0 3  Moving slowly or fidgety/restless 0 0 0  Suicidal thoughts 0 0 2  PHQ-9 Score 4 4 23   Difficult doing work/chores - - Very difficult     OBJECTIVE:   BP 108/67   Pulse 69   Temp (!) 97.5 F (36.4 C) (Oral)   Wt 292 lb (132.5 kg)   SpO2 97%   BMI 40.73 kg/m   Gen: overweight, in NAD Psych: appropriately dressed and well groomed, no flight of ideas or tangential thought process. Depressed affect  ASSESSMENT/PLAN:   Depression, major, single episode, severe (HCC) PHQ/GAD scoring improved though still with significant impact on daily functioning. Again recommended couples counseling given ongoing marital stress, CCM referral sent. Will switch citalopram to  zoloft given sexual dysfunction. F/u in 4 weeks.    14/06/2020, DO

## 2020-10-23 NOTE — Assessment & Plan Note (Signed)
PHQ/GAD scoring improved though still with significant impact on daily functioning. Again recommended couples counseling given ongoing marital stress, CCM referral sent. Will switch citalopram to zoloft given sexual dysfunction. F/u in 4 weeks.

## 2020-10-23 NOTE — Patient Instructions (Signed)
It was great to see you!  Our plans for today:  - Check out [email or web address] for counseling resources. I will also have our social worker reach out to you for resources as well.  - We are switching your citalopram to sertraline. You can start this tomorrow. - Try keeping a notepad by your bed at night and jotting down things when you feel your mind is racing. - Keep up the great work exercising and losing weight! - Come back to see [place] in 4 weeks.   Take care and seek immediate care sooner if you develop any concerns.   Dr. Linwood Dibbles

## 2020-10-24 ENCOUNTER — Telehealth: Payer: Self-pay

## 2020-10-24 NOTE — Chronic Care Management (AMB) (Unsigned)
  Care Management   Outreach Note  10/24/2020 Name: Vera Wishart. MRN: [id number] DOB: 1966-07-23  Referred by: Particia Nearing, PA-C Reason for referral : Care Coordination (Outreach to schedule referral with LCSW )   [name]. is enrolled in a Managed Medicaid Health Plan: No  An unsuccessful telephone outreach was attempted today. The patient was referred to the case management team for assistance with care management and care coordination.   Follow Up Plan: The care management team will reach out to the patient again over the next 7 days.  If patient returns call to provider office, please advise to call Embedded Care Management Care Guide Penne Lash  at [phone]  Penne Lash, RMA Care Guide, Embedded Care Coordination [facility]  [place], [place] [address] Direct Dial: [phone] [email or web address] Website: [email or web address]

## 2020-10-28 NOTE — Chronic Care Management (AMB) (Signed)
  Care Management   Note  10/28/2020 Name: Walter Mcclain. MRN: [id number] DOB: March 30, 1966  Walter Mcclain. is a 55 y.o. year old male who is a primary care patient of Particia Nearing, [place]. I reached out to [facility]. by phone today in response to a referral sent by Mr. Walter Burow Jr.'s PCP Walter Maser PA-C.    [name] was given information about care management services today including:  1. Care management services include personalized support from designated clinical staff supervised by his physician, including individualized plan of care and coordination with other care providers 2. 24/7 contact phone numbers for assistance for urgent and routine care needs. 3. The patient may stop care management services at any time by phone call to the office staff.  Patient agreed to services and verbal consent obtained.   Follow up plan: Telephone appointment with care management team member scheduled for:10/29/2020  Penne Lash, RMA Care Guide, Embedded Care Coordination [facility]  [place], [place] [address] Direct Dial: [phone] [email or web address] Website: [email or web address]

## 2020-10-29 ENCOUNTER — Ambulatory Visit: Payer: Managed Care, Other (non HMO) | Admitting: Licensed Clinical Social Worker

## 2020-10-29 NOTE — Chronic Care Management (AMB) (Signed)
Care Management Clinical Social Work Note  10/29/2020 Name: Walter Mcclain. MRN: [id number] DOB: Mar 20, 1966  Walter Mcclain. is a 55 y.o. year old male who is a primary care patient of Particia Mcclain, [place].  The Care Management team was consulted for assistance with chronic disease management and coordination needs.  Engaged with patient by telephone for initial visit in response to provider referral for social work chronic care management and care coordination services  Consent to Services:  Mr. Dowty was given information about Care Management services today including:  1. Care Management services includes personalized support from designated clinical staff supervised by his physician, including individualized plan of care and coordination with other care providers 2. 24/7 contact phone numbers for assistance for urgent and routine care needs. 3. The patient may stop case management services at any time by phone call to the office staff.  Patient agreed to services and consent obtained.   Assessment/Interventions: Review of patient past medical history, allergies, medications, and health status, including review of relevant consultants reports was performed today as part of a comprehensive evaluation and provision of chronic care management and care coordination services.  SDOH (Social Determinants of Health) assessments and interventions performed:    Advanced Directives Status: See Care Plan for related entries.  Care Plan  Allergies  Allergen Reactions  . Chantix [Varenicline] Other (See Comments)    Lip swelling    Outpatient Encounter Medications as of 10/29/2020  Medication Sig  . indomethacin (INDOCIN) 50 MG capsule TAKE 1 CAPSULE (50 MG TOTAL) BY MOUTH 2 (TWO) TIMES DAILY AS NEEDED. FOR GOUT FLARES  . LORazepam (ATIVAN) 0.5 MG tablet Take 1 tablet (0.5 mg total) by mouth at bedtime as needed for anxiety.  . sertraline (ZOLOFT) 50 MG tablet Take 1  tablet (50 mg total) by mouth daily.   No facility-administered encounter medications on file as of 10/29/2020.    Patient Active Problem List   Diagnosis Date Noted  . Depression, major, single episode, severe (HCC) 09/25/2020  . Encounter for screening colonoscopy   . COPD (chronic obstructive pulmonary disease) (HCC) 05/11/2020  . Gout 04/01/2020  . Cigarette smoker 04/01/2020  . Obesity 04/01/2020  . Chronic low back pain 04/01/2020  . Chronic gouty arthritis 11/19/2015    Conditions to be addressed/monitored: Depression; Limited social support and Mental Health Concerns   Care Plan : General Social Work (Adult)  Updates made by Gustavus Bryant, LCSW since 10/29/2020 12:00 AM    Problem: Depression Identification (Depression)     Goal: Depressive Symptoms Identified   Start Date: 10/29/2020  Priority: Medium  Note:   Evidence-based guidance:   Identify risk for depression by reviewing presenting symptoms and risk factors.   Review use of medications that contribute to depression such as steroid, narcotic, sedative, antihypertensive, beta blocker, cytoxic agent.   Review related metabolic processes, including infection, anemia, thyroid dysfunction, kidney failure, heart failure, alcohol or substance use.   Perform depression screening using standardized tools to obtain baseline intensity of depressive symptoms.   Perform or refer for a full diagnostic interview when positive screening results are noted; use DSM-5 criteria to determine appropriate diagnosis (e.g., major depression, persistent depressive disorder, unspecified depressive    disorder).   Notes:   Timeframe:  Long-Range Goal Priority:  Medium Start Date: 10/29/20  Expected End Date: 12/27/20                     Follow Up Date- 90 days from 10/29/20   - begin personal counseling - call and visit an old friend - check out volunteer opportunities - join a support group - laugh;  watch a funny movie or comedian - learn and use visualization or guided imagery - perform a random act of kindness - practice relaxation or meditation daily - start or continue a personal journal - talk about feelings with a friend, family or spiritual advisor - practice positive thinking and self-talk    Why is this important?    When you are stressed, down or upset, your body reacts too.   For example, your blood pressure may get higher; you may have a headache or stomachache.   When your emotions get the best of you, your body's ability to fight off cold and flu gets weak.   These steps will help you manage your emotions.    Clinical Social Work Goal(s):   Over the next 120 days, patient/caregiver will work with SW to address concerns related to care coordination needs, lack of a stable support network and lack of Economist. LCSW will assist patient in gaining additional support in order to maintain health and mental health appropriately   Over the next 120 days, patient will demonstrate improved adherence to self care as evidenced by implementing healthy self-care into his daily routine such as: attending all medical appointments, deep breathing exercises, taking time for self-reflection, taking medications as prescribed, drinking water and daily exercise to improve mobility and mood.   Over the next 120 days, patient will demonstrate improved health management independence as evidenced by implementing healthy self-care skills and positive support/resources into his daily routine to help cope with stressors and improve overall health and well-being   Over the next 120 days, patient or caregiver will verbalize basic understanding of depression/stress process and self health management plan as evidenced by his participation in development of long term plan of care and institution of self health management strategies     Interventions:  Patient  interviewed and appropriate assessments performed: brief mental health assessment  Patient reports that he and his spouse are interested in gaining martial therapy. Patient and spouse both have [employer].   Patient shares that PCP put him on a new medication to help treat his depressive symptoms. He recently started this and is looking forward to gaining some relief.  Anxiety management coping skill education completed  Provided mental health counseling and motivational interviewing intervention with regard to anxiety management/relapse prevention.  Provided patient with information about available mental health support resources within his area that provide counseling for couples: [facility], [employer], [employer] and [employer].  LCSW provided education on healthy self care and what that looks like. Advised patient to implement deep breathing/grounding/meditation/self-care exercises into his daily routine to combat racing thoughts.   Discussed plans with patient for ongoing care management follow up and provided patient with direct contact information for care management team  Advised patient to contact CCM LCSW if he has any mental health concerns  Assisted patient/caregiver with obtaining information about health plan benefits  Provided education and assistance to client regarding Advanced Directives.  Encouraged patient to contact [facility] or [facility] for long term follow up and therapy/counseling. Text message sent to patient with these resources on 10/29/20.  Mindfulness  and Relaxation Training education provided to patient  Active listening utilized, counseling provided, current coping strategies identified, decision-making supported, healthy lifestyle promoted, journaling promoted, meditative movement therapy encouraged, mindfulness encouraged, participation in counseling encouraged, problem-solving facilitated, relaxation  techniques promoted, self-reflection promoted, spiritual activities promoted, verbalization of feelings encouraged, affirmation provided, community involvement promoted, expression of thoughts about present/future encouraged, independence in all possible areas promote, life review by storytelling encouraged, patient strengths promoted, psychosocial concerns monitored, self-expression encouraged, sleep diary encouraged, sleep hygiene techniques encouraged, social relationships promoted, strategies to maintain hearing and/or vision promoted and wellness behaviors promoted   Patient Self Care Activities:   Attends all scheduled provider appointments  Calls provider office for new concerns or questions  Ability for insight  Independent living  Motivation for treatment   Patient Coping Strengths:   Supportive Relationships  Hopefulness  Self Advocate  Able to Communicate Effectively   Patient Self Care Deficits:   Lacks social connections    Task: Identify Depressive Symptoms and Facilitate Treatment   Note:   Care Management Activities:    - mental health treatment arranged - participation in psychiatric services encouraged    Notes:       Follow Up Plan: SW will follow up with patient by phone over the next quarter   Dickie La, BSW, MSW, LCSW [facility] Care Management [place]  [email or web address] Phone: [phone]

## 2020-11-07 ENCOUNTER — Other Ambulatory Visit: Payer: Managed Care, Other (non HMO)

## 2020-11-07 DIAGNOSIS — Z20822 Contact with and (suspected) exposure to covid-19: Secondary | ICD-10-CM

## 2020-11-09 LAB — SARS-COV-2, NAA 2 DAY TAT

## 2020-11-09 LAB — NOVEL CORONAVIRUS, NAA: SARS-CoV-2, NAA: DETECTED — AB

## 2020-11-10 ENCOUNTER — Telehealth: Payer: Self-pay

## 2020-11-10 NOTE — Telephone Encounter (Signed)
Called to discuss with patient about COVID-19 symptoms and the use of one of the available treatments for those with mild to moderate Covid symptoms and at a high risk of hospitalization.  Pt appears to qualify for outpatient treatment due to co-morbid conditions and/or a member of an at-risk group in accordance with the FDA Emergency Use Authorization.    Symptom onset: Unknown Vaccinated: Unknown Booster? Unknown Immunocompromised? Unknown Qualifiers: COPD  Unable to reach pt - Voice mailbox is full, unable to leave message.  Walter Mcclain

## 2020-11-15 ENCOUNTER — Encounter: Payer: Self-pay | Admitting: Nurse Practitioner

## 2020-11-19 ENCOUNTER — Ambulatory Visit: Payer: Managed Care, Other (non HMO) | Admitting: Nurse Practitioner

## 2020-11-19 ENCOUNTER — Other Ambulatory Visit: Payer: Self-pay

## 2020-11-19 ENCOUNTER — Encounter: Payer: Self-pay | Admitting: Nurse Practitioner

## 2020-11-19 VITALS — BP 110/69 | HR 75 | Temp 97.8°F

## 2020-11-19 DIAGNOSIS — F17210 Nicotine dependence, cigarettes, uncomplicated: Secondary | ICD-10-CM | POA: Diagnosis not present

## 2020-11-19 DIAGNOSIS — Z6841 Body Mass Index (BMI) 40.0 and over, adult: Secondary | ICD-10-CM

## 2020-11-19 DIAGNOSIS — F322 Major depressive disorder, single episode, severe without psychotic features: Secondary | ICD-10-CM

## 2020-11-19 NOTE — Patient Instructions (Signed)

## 2020-11-19 NOTE — Progress Notes (Signed)
BP 110/69   Pulse 75   Temp 97.8 F (36.6 C) (Oral)   SpO2 95%    Subjective:    Patient ID: Walter Carrow., male    DOB: [birth year], 55 y.o.   MRN: [id number]  HPI: Walter Hallstrom. is a 54 y.o. male  Chief Complaint  Patient presents with  . Depression   DEPRESSION Has not taken any medications in past 2 weeks -- reports overall mood had improved.  Originally started on it due to marital issues + has child who is heroine addict that was living with him at the time.  Does continue to smoke, smoking 1/2 PPD.  Has smoked on and off over last 30 years -- was a 1 PPD smoker in past.   Mood status: stable Satisfied with current treatment?: yes Symptom severity: mild  Duration of current treatment : chronic Side effects: had headaches with medication Psychotherapy/counseling: yes in the past Depressed mood: no Anxious mood: no Anhedonia: no Significant weight loss or gain: no Insomnia: none Fatigue: no Feelings of worthlessness or guilt: no Impaired concentration/indecisiveness: no Suicidal ideations: no Hopelessness: no Crying spells: no Depression screen [facility] 2/9 11/19/2020 10/23/2020 10/09/2020 09/25/2020 07/10/2020  Decreased Interest 0 0 0 3 0  Down, Depressed, Hopeless 0 1 1 3  0  PHQ - 2 Score 0 1 1 6  0  Altered sleeping 0 2 2 3  -  Tired, decreased energy 0 0 0 3 -  Change in appetite 0 1 0 3 -  Feeling bad or failure about yourself  0 0 1 3 -  Trouble concentrating 0 0 0 3 -  Moving slowly or fidgety/restless 0 0 0 0 -  Suicidal thoughts 0 0 0 2 -  PHQ-9 Score 0 4 4 23  -  Difficult doing work/chores - - - Very difficult -    Relevant past medical, surgical, family and social history reviewed and updated as indicated. Interim medical history since our last visit reviewed. Allergies and medications reviewed and updated.  Review of Systems  Constitutional: Negative for activity change, diaphoresis, fatigue and fever.  Respiratory: Negative for cough,  chest tightness, shortness of breath and wheezing.   Cardiovascular: Negative for chest pain, palpitations and leg swelling.  Gastrointestinal: Negative.   Neurological: Negative.   Psychiatric/Behavioral: Negative.     Per HPI unless specifically indicated above     Objective:    BP 110/69   Pulse 75   Temp 97.8 F (36.6 C) (Oral)   SpO2 95%   Wt Readings from Last 3 Encounters:  10/23/20 292 lb (132.5 kg)  10/09/20 297 lb (134.7 kg)  09/25/20 (!) 302 lb (137 kg)    Physical Exam Vitals and nursing note reviewed.  Constitutional:      General: He is awake. He is not in acute distress.    Appearance: He is well-developed and well-groomed. He is obese. He is not ill-appearing.  HENT:     Head: Normocephalic and atraumatic.     Right Ear: Hearing normal. No drainage.     Left Ear: Hearing normal. No drainage.  Eyes:     General: Lids are normal.        Right eye: No discharge.        Left eye: No discharge.     Conjunctiva/sclera: Conjunctivae normal.     Pupils: Pupils are equal, round, and reactive to light.  Neck:     Thyroid: No thyromegaly.     Vascular: No  carotid bruit.     Trachea: Trachea normal.  Cardiovascular:     Rate and Rhythm: Normal rate and regular rhythm.     Heart sounds: Normal heart sounds, S1 normal and S2 normal. No murmur heard. No gallop.   Pulmonary:     Effort: Pulmonary effort is normal. No accessory muscle usage or respiratory distress.     Breath sounds: Normal breath sounds.  Abdominal:     General: Bowel sounds are normal.     Palpations: Abdomen is soft.  Musculoskeletal:        General: Normal range of motion.     Cervical back: Normal range of motion and neck supple.     Right lower leg: No edema.     Left lower leg: No edema.  Skin:    General: Skin is warm and dry.     Capillary Refill: Capillary refill takes less than 2 seconds.  Neurological:     Mental Status: He is alert and oriented to person, place, and time.      Deep Tendon Reflexes: Reflexes are normal and symmetric.     Reflex Scores:      Brachioradialis reflexes are 2+ on the right side and 2+ on the left side.      Patellar reflexes are 2+ on the right side and 2+ on the left side. Psychiatric:        Attention and Perception: Attention normal.        Mood and Affect: Mood normal.        Speech: Speech normal.        Behavior: Behavior normal. Behavior is cooperative.        Thought Content: Thought content normal.     Results for orders placed or performed in visit on 11/07/20  Novel Coronavirus, NAA ([facility])   Specimen: Nasopharyngeal(NP) swabs in vial transport medium   Nasopharynge  Result Value Ref Range   SARS-CoV-2, NAA Detected (A) Not Detected  SARS-COV-2, NAA 2 DAY TAT   Nasopharynge  Result Value Ref Range   SARS-CoV-2, NAA 2 DAY TAT Performed       Assessment & Plan:   Problem List Items Addressed This Visit      Other   Nicotine dependence, cigarettes, uncomplicated    I have recommended complete cessation of tobacco use. I have discussed various options available for assistance with tobacco cessation including over the counter methods (Nicotine gum, patch and lozenges). We also discussed prescription options (Chantix, Nicotine Inhaler / Nasal Spray). The patient is not interested in pursuing any prescription tobacco cessation options at this time.  Recommend lung CA screening and provided pamphlet to him on this -- he will notify if interested.       Obesity    Refused to be weighed today.  Recommended eating smaller high protein, low fat meals more frequently and exercising 30 mins a day 5 times a week with a goal of 10-15lb weight loss in the next 3 months. Patient voiced their understanding and motivation to adhere to these recommendations.       Depression, major, single episode, severe (HCC) - Primary    Chronic, stable.  Currently not taking any medications and does not feel need for them.  Denies SI/HI.   Continue to monitor and restart medication as needed.  Return in 6 months, sooner if worsening mood.          Follow up plan: Return in about 6 months (around 05/19/2021) for MOOD and COPD --  meet new PCP.

## 2020-11-19 NOTE — Assessment & Plan Note (Signed)
Chronic, stable.  Currently not taking any medications and does not feel need for them.  Denies SI/HI.  Continue to monitor and restart medication as needed.  Return in 6 months, sooner if worsening mood.

## 2020-11-19 NOTE — Assessment & Plan Note (Signed)
I have recommended complete cessation of tobacco use. I have discussed various options available for assistance with tobacco cessation including over the counter methods (Nicotine gum, patch and lozenges). We also discussed prescription options (Chantix, Nicotine Inhaler / Nasal Spray). The patient is not interested in pursuing any prescription tobacco cessation options at this time.  Recommend lung CA screening and provided pamphlet to him on this -- he will notify if interested.

## 2020-11-19 NOTE — Assessment & Plan Note (Signed)
Refused to be weighed today.  Recommended eating smaller high protein, low fat meals more frequently and exercising 30 mins a day 5 times a week with a goal of 10-15lb weight loss in the next 3 months. Patient voiced their understanding and motivation to adhere to these recommendations.

## 2020-12-25 ENCOUNTER — Telehealth: Payer: Self-pay

## 2020-12-25 ENCOUNTER — Telehealth: Payer: Self-pay | Admitting: Licensed Clinical Social Worker

## 2020-12-25 NOTE — Telephone Encounter (Signed)
  Chronic Care Management    Clinical Social Work General Follow Up Note  12/25/2020 Name: Walter Mcclain. MRN: [id number] DOB: 1966-01-04  Walter Mcclain. is a 55 y.o. year old male who is a primary care patient of Larae Grooms, NP. The CCM team was consulted for assistance with Mental Health Counseling and Resources.   Review of patient status, including review of consultants reports, relevant laboratory and other test results, and collaboration with appropriate care team members and the patient's provider was performed as part of comprehensive patient evaluation and provision of chronic care management services.    LCSW completed CCM outreach attempt today but was unable to reach patient successfully. A HIPPA compliant voice message was left encouraging patient to return call once available. LCSW will ask Scheduling Care Guide to reschedule CCM SW appointment with patient as well.   Outpatient Encounter Medications as of 12/25/2020  Medication Sig  . indomethacin (INDOCIN) 50 MG capsule TAKE 1 CAPSULE (50 MG TOTAL) BY MOUTH 2 (TWO) TIMES DAILY AS NEEDED. FOR GOUT FLARES   No facility-administered encounter medications on file as of 12/25/2020.    Follow Up Plan: Scheduling Care Guide will reach out to patient to reschedule appointment.   Dickie La, BSW, MSW, LCSW [facility] Care Management [place]  [facility] [email or web address] Phone: [phone]

## 2021-01-09 ENCOUNTER — Telehealth: Payer: Self-pay

## 2021-01-09 NOTE — Chronic Care Management (AMB) (Signed)
  Care Management   Note  01/09/2021 Name: Walter Mcclain. MRN: [id number] DOB: September 05, 1966  Walter Mcclain. is a 55 y.o. year old male who is a primary care patient of Larae Grooms, NP and is actively engaged with the care management team. I reached out to [facility]. by phone today to assist with re-scheduling a follow up visit with the Licensed Clinical Social Worker  Follow up plan: Patient declines further follow up and engagement by the care management team. Appropriate care team members and provider have been notified via electronic communication.   Penne Lash, RMA Care Guide, Embedded Care Coordination [facility]  [place], [place] [address] Direct Dial: [phone] [email or web address]

## 2021-05-20 ENCOUNTER — Ambulatory Visit: Payer: Managed Care, Other (non HMO) | Admitting: Nurse Practitioner

## 2021-05-20 ENCOUNTER — Other Ambulatory Visit: Payer: Self-pay

## 2021-05-20 ENCOUNTER — Encounter: Payer: Self-pay | Admitting: Nurse Practitioner

## 2021-05-20 VITALS — BP 107/71 | HR 58 | Temp 97.9°F | Wt 284.2 lb

## 2021-05-20 DIAGNOSIS — F322 Major depressive disorder, single episode, severe without psychotic features: Secondary | ICD-10-CM | POA: Diagnosis not present

## 2021-05-20 DIAGNOSIS — J449 Chronic obstructive pulmonary disease, unspecified: Secondary | ICD-10-CM

## 2021-05-20 DIAGNOSIS — Z0289 Encounter for other administrative examinations: Secondary | ICD-10-CM

## 2021-05-20 MED ORDER — OMEPRAZOLE 20 MG PO CPDR: 20.0000 mg | DELAYED_RELEASE_CAPSULE | Freq: Every day | ORAL | 1 refills | Status: DC

## 2021-05-20 MED ORDER — VENLAFAXINE HCL ER 37.5 MG PO CP24: 37.5000 mg | ORAL_CAPSULE | Freq: Every day | ORAL | 0 refills | Status: DC

## 2021-05-20 MED ORDER — INDOMETHACIN 50 MG PO CAPS: 50.0000 mg | ORAL_CAPSULE | Freq: Two times a day (BID) | ORAL | 0 refills | Status: DC | PRN

## 2021-05-20 NOTE — Assessment & Plan Note (Signed)
Chronic. Uncontrolled.  Will change medication from Celexa to Effexor to see if symptoms improve.  Will start at the lowest dose and increase as needed.  Side effects and benefits discussed with patient during visit. Follow up in 1 month for reevaluation.

## 2021-05-20 NOTE — Progress Notes (Signed)
BP 107/71   Pulse (!) 58   Temp 97.9 F (36.6 C) (Oral)   Wt 284 lb 3.2 oz (128.9 kg)   SpO2 97%   BMI 39.64 kg/m    Subjective:    Patient ID: Walter Gaster., male    DOB: Sep 26, 1966, 55 y.o.   MRN: [id number]  HPI: Walter Rigg. is a 55 y.o. male  Chief Complaint  Patient presents with   MOOD    Patient states with his anxiety and depression, he stays in the house and does not really leave the house and when he leaves the house and goes to grab food. He asks if he can have a seat close to the door as he has to see and know what is going every minute.    COPD    Patient states he still does not walk long distances as he is still having SOB and states he does not see any changes when he uses his inhaler.    COPD COPD status: uncontrolled Satisfied with current treatment?: no Oxygen use: no Dyspnea frequency: daily Cough frequency: smokers cough Rescue inhaler frequency:  does use albuterol but does not feel like it improves his breathing Limitation of activity: yes Productive cough: no Last Spirometry:  Pneumovax: Not up to Date Influenza: Not up to Date  DEPRESSION/ANXIETY Patient states he doesn't really see a difference in his depression/anxiety. Patient states his anxiety is worse when he can't see people around him.  He rarely goes out to eat but when he does he wants something close to the door. Denies SI.  [place] Office Visit from 05/20/2021 in [place]  PHQ-9 Total Score 14      GAD 7 : Generalized Anxiety Score 05/20/2021 10/23/2020 10/09/2020 09/25/2020  Nervous, Anxious, on Edge '2 1 1 3  ' Control/stop worrying 2 1 0 3  Worry too much - different things 2 1 0 3  Trouble relaxing '1 1 1 3  ' Restless 2 0 0 3  Easily annoyed or irritable 1 0 0 3  Afraid - awful might happen 2 0 0 3  Total GAD 7 Score '12 4 2 21  ' Anxiety Difficulty Very difficult Not difficult at all Not difficult at all Very difficult     Relevant past  medical, surgical, family and social history reviewed and updated as indicated. Interim medical history since our last visit reviewed. Allergies and medications reviewed and updated.  Review of Systems  Respiratory:  Positive for cough and shortness of breath.   Psychiatric/Behavioral:  Positive for dysphoric mood. Negative for suicidal ideas. The patient is nervous/anxious.    Per HPI unless specifically indicated above     Objective:    BP 107/71   Pulse (!) 58   Temp 97.9 F (36.6 C) (Oral)   Wt 284 lb 3.2 oz (128.9 kg)   SpO2 97%   BMI 39.64 kg/m   Wt Readings from Last 3 Encounters:  05/20/21 284 lb 3.2 oz (128.9 kg)  10/23/20 292 lb (132.5 kg)  10/09/20 297 lb (134.7 kg)    Physical Exam Vitals and nursing note reviewed.  Constitutional:      General: He is not in acute distress.    Appearance: Normal appearance. He is obese. He is not ill-appearing, toxic-appearing or diaphoretic.  HENT:     Head: Normocephalic.     Right Ear: External ear normal.     Left Ear: External ear normal.  Nose: Nose normal. No congestion or rhinorrhea.     Mouth/Throat:     Mouth: Mucous membranes are moist.  Eyes:     General:        Right eye: No discharge.        Left eye: No discharge.     Extraocular Movements: Extraocular movements intact.     Conjunctiva/sclera: Conjunctivae normal.     Pupils: Pupils are equal, round, and reactive to light.  Cardiovascular:     Rate and Rhythm: Normal rate and regular rhythm.     Heart sounds: No murmur heard. Pulmonary:     Effort: Pulmonary effort is normal. No respiratory distress.     Breath sounds: Normal breath sounds. No wheezing, rhonchi or rales.  Abdominal:     General: Abdomen is flat. Bowel sounds are normal.  Musculoskeletal:     Cervical back: Normal range of motion and neck supple.  Skin:    General: Skin is warm and dry.     Capillary Refill: Capillary refill takes less than 2 seconds.  Neurological:     General:  No focal deficit present.     Mental Status: He is alert and oriented to person, place, and time.  Psychiatric:        Mood and Affect: Mood normal.        Behavior: Behavior normal.        Thought Content: Thought content normal.        Judgment: Judgment normal.    Results for orders placed or performed in visit on 11/07/20  Novel Coronavirus, NAA ([facility])   Specimen: Nasopharyngeal(NP) swabs in vial transport medium   Nasopharynge  Result Value Ref Range   SARS-CoV-2, NAA Detected (A) Not Detected  SARS-COV-2, NAA 2 DAY TAT   Nasopharynge  Result Value Ref Range   SARS-CoV-2, NAA 2 DAY TAT Performed       Assessment & Plan:   Problem List Items Addressed This Visit       Respiratory   COPD (chronic obstructive pulmonary disease) ([place]) - Primary    Chronic.  Not well controlled.  Patient unsure of what inhaler he is using.  Albuterol does not help his SOB. Patient will bring inhaler to next visit. If unable to adjust inhaler to help with patient's breathing will likely send to Pulmonology for evaluation.          Other   Depression, major, single episode, severe (HCC)    Chronic. Uncontrolled.  Will change medication from Celexa to Effexor to see if symptoms improve.  Will start at the lowest dose and increase as needed.  Side effects and benefits discussed with patient during visit. Follow up in 1 month for reevaluation.        Relevant Medications   venlafaxine XR (EFFEXOR XR) 37.5 MG 24 hr capsule   Other Visit Diagnoses     Encounter for completion of form with patient       Patient needed form completed and blood work for the form done at visit today.    Relevant Orders   Lipid Profile   Comp Met (CMET)   SAR CoV2 Serology (COVID 19)AB(IGG)IA   HgB A1c        Follow up plan: Return in about 1 month (around 06/20/2021) for Depression/Anxiety FU.

## 2021-05-20 NOTE — Assessment & Plan Note (Signed)
Chronic.  Not well controlled.  Patient unsure of what inhaler he is using.  Albuterol does not help his SOB. Patient will bring inhaler to next visit. If unable to adjust inhaler to help with patient's breathing will likely send to Pulmonology for evaluation.

## 2021-05-21 LAB — COMPREHENSIVE METABOLIC PANEL
ALT: 23 IU/L (ref 0–44)
AST: 22 IU/L (ref 0–40)
Albumin/Globulin Ratio: 1.9 (ref 1.2–2.2)
Albumin: 4.5 g/dL (ref 3.8–4.9)
Alkaline Phosphatase: 139 IU/L — ABNORMAL HIGH (ref 44–121)
BUN/Creatinine Ratio: 10 (ref 9–20)
BUN: 10 mg/dL (ref 6–24)
Bilirubin Total: 0.5 mg/dL (ref 0.0–1.2)
CO2: 23 mmol/L (ref 20–29)
Calcium: 9.1 mg/dL (ref 8.7–10.2)
Chloride: 102 mmol/L (ref 96–106)
Creatinine, Ser: 1.04 mg/dL (ref 0.76–1.27)
Globulin, Total: 2.4 g/dL (ref 1.5–4.5)
Glucose: 83 mg/dL (ref 65–99)
Potassium: 4.4 mmol/L (ref 3.5–5.2)
Sodium: 142 mmol/L (ref 134–144)
Total Protein: 6.9 g/dL (ref 6.0–8.5)
eGFR: 86 mL/min/{1.73_m2} (ref >59)

## 2021-05-21 LAB — HEMOGLOBIN A1C
Est. average glucose Bld gHb Est-mCnc: 111 mg/dL
Hgb A1c MFr Bld: 5.5 % (ref 4.8–5.6)

## 2021-05-21 LAB — LIPID PANEL
Chol/HDL Ratio: 5.1 ratio — ABNORMAL HIGH (ref 0.0–5.0)
Cholesterol, Total: 173 mg/dL (ref 100–199)
HDL: 34 mg/dL — ABNORMAL LOW (ref >39)
LDL Chol Calc (NIH): 112 mg/dL — ABNORMAL HIGH (ref 0–99)
Triglycerides: 149 mg/dL (ref 0–149)
VLDL Cholesterol Cal: 27 mg/dL (ref 5–40)

## 2021-05-21 LAB — SAR COV2 SEROLOGY (COVID19)AB(IGG),IA
SARS-CoV-2 Semi-Quant IgG Ab: >800 AU/mL (ref <13.0)
SARS-CoV-2 Spike Ab Interp: POSITIVE

## 2021-05-21 NOTE — Progress Notes (Signed)
Please let patient know that his lab work looks good.  Cholesterol has improved from prior.  No evidence of diabetes.  Liver, kidneys and electrolytes look good.  Follow up as discussed.

## 2021-05-26 ENCOUNTER — Telehealth: Payer: Self-pay

## 2021-05-26 NOTE — Telephone Encounter (Signed)
Spoke with patient and informed him that his paperwork was faxed back over to company. Patient was advised to reach out to patient and check the status. Patient was advised to give our office a call back if he has any questions or concerns. Patient verbalized understanding.

## 2021-05-26 NOTE — Telephone Encounter (Signed)
Copied from CRM [id number]. Topic: General - Call Back - No Documentation >> May 25, 2021  1:47 PM Randol Kern wrote: Reason for CRM: Pt called and wants to know status of his insurance form that he dropped off last week during his visit. Wants call back to learn status.  [phone]

## 2021-06-11 ENCOUNTER — Telehealth: Payer: Self-pay

## 2021-06-11 NOTE — Telephone Encounter (Signed)
Form printed

## 2021-06-11 NOTE — Telephone Encounter (Signed)
Walter Mcclain will print a copy of labs. Will notify patient when available.

## 2021-06-11 NOTE — Telephone Encounter (Signed)
Ready for pick up, patient notified.

## 2021-06-11 NOTE — Telephone Encounter (Signed)
Pt would like to know if he can by the office today and get a copy of form the insurance company is now saying they never received the form

## 2021-06-11 NOTE — Telephone Encounter (Signed)
Copied from CRM [id number]. Topic: General - Inquiry >> Jun 11, 2021  9:59 AM Daphine Deutscher D wrote: Reason for CRM: Pt called asking if he could come by and get a copy of the labs from his last visit.  He ask if he can pick it up today.  Tomorrow is the deadline for him to have it . CB#  [phone]

## 2021-06-16 ENCOUNTER — Other Ambulatory Visit: Payer: Self-pay | Admitting: Nurse Practitioner

## 2021-06-16 NOTE — Telephone Encounter (Signed)
Requested medication (s) are due for refill today: yes  Requested medication (s) are on the active medication list: yes  Last refill:  06/16/21  Future visit scheduled: yes  Notes to clinic:  pt requesting 90 day refill on venlafaxine   Requested Prescriptions  Pending Prescriptions Disp Refills   venlafaxine XR (EFFEXOR-XR) 37.5 MG 24 hr capsule [Pharmacy Med Name: VENLAFAXINE HCL ER 37.5 MG CAP] 90 capsule 1    Sig: TAKE 1 CAPSULE BY MOUTH DAILY WITH BREAKFAST.     Psychiatry: Antidepressants - SNRI - desvenlafaxine & venlafaxine Failed - 06/16/2021  8:32 AM      Failed - LDL in normal range and within 360 days    LDL Chol Calc (NIH)  Date Value Ref Range Status  05/20/2021 112 (H) 0 - 99 mg/dL Final          Passed - Total Cholesterol in normal range and within 360 days    Cholesterol, Total  Date Value Ref Range Status  05/20/2021 173 100 - 199 mg/dL Final          Passed - Triglycerides in normal range and within 360 days    Triglycerides  Date Value Ref Range Status  05/20/2021 149 0 - 149 mg/dL Final          Passed - Completed PHQ-2 or PHQ-9 in the last 360 days      Passed - Last BP in normal range    BP Readings from Last 1 Encounters:  05/20/21 107/71          Passed - Valid encounter within last 6 months    Recent Outpatient Visits           3 weeks ago Chronic obstructive pulmonary disease, unspecified COPD type (HCC)   [facility] Larae Grooms, NP   6 months ago Depression, major, single episode, severe (HCC)   [facility] [place], Jolene T, NP   7 months ago Depression, major, single episode, severe (HCC)   [facility] Caro Laroche, DO   8 months ago Depression, major, single episode, severe [facility])   [facility] Caro Laroche, DO   8 months ago Depression, major, single episode, severe (HCC)   [facility] Gabriel Cirri, NP       Future Appointments              In 1 week Larae Grooms, NP [facility]

## 2021-06-23 ENCOUNTER — Encounter: Payer: Self-pay | Admitting: Nurse Practitioner

## 2021-06-23 ENCOUNTER — Other Ambulatory Visit: Payer: Self-pay

## 2021-06-23 ENCOUNTER — Ambulatory Visit: Payer: Managed Care, Other (non HMO) | Admitting: Nurse Practitioner

## 2021-06-23 VITALS — BP 122/77 | HR 60 | Temp 97.9°F | Ht 70.4 in | Wt 283.0 lb

## 2021-06-23 DIAGNOSIS — F322 Major depressive disorder, single episode, severe without psychotic features: Secondary | ICD-10-CM | POA: Diagnosis not present

## 2021-06-23 MED ORDER — TRAZODONE HCL 50 MG PO TABS: 25.0000 mg | ORAL_TABLET | Freq: Every evening | ORAL | 3 refills | Status: DC | PRN

## 2021-06-23 MED ORDER — VENLAFAXINE HCL ER 37.5 MG PO CP24: 37.5000 mg | ORAL_CAPSULE | Freq: Every day | ORAL | 1 refills | Status: DC

## 2021-06-23 NOTE — Progress Notes (Signed)
BP 122/77   Pulse 60   Temp 97.9 F (36.6 C) (Oral)   Ht 5' 10.4" (1.788 m)   Wt 283 lb (128.4 kg)   SpO2 95%   BMI 40.15 kg/m    Subjective:    Patient ID: Walter Gaster., male    DOB: May 23, 1966, 55 y.o.   MRN: [id number]  HPI: Walter Mole. is a 55 y.o. male  Chief Complaint  Patient presents with   Depression    1 month f.up   DEPRESSION Patient states he feels like the medication is working well.  He feels like he needs something to help him slow down at night.  His mind is going constantly at night and he isn't able to fall asleep.  Patient has tried tylenol PM, Benadryl, and Melatonin.  Denies SI.  [place] Office Visit from 06/23/2021 in [place]  PHQ-9 Total Score 4       Relevant past medical, surgical, family and social history reviewed and updated as indicated. Interim medical history since our last visit reviewed. Allergies and medications reviewed and updated.  Review of Systems  Psychiatric/Behavioral:  Positive for dysphoric mood and sleep disturbance. Negative for suicidal ideas.    Per HPI unless specifically indicated above     Objective:    BP 122/77   Pulse 60   Temp 97.9 F (36.6 C) (Oral)   Ht 5' 10.4" (1.788 m)   Wt 283 lb (128.4 kg)   SpO2 95%   BMI 40.15 kg/m   Wt Readings from Last 3 Encounters:  06/23/21 283 lb (128.4 kg)  05/20/21 284 lb 3.2 oz (128.9 kg)  10/23/20 292 lb (132.5 kg)    Physical Exam Vitals and nursing note reviewed.  Constitutional:      General: He is not in acute distress.    Appearance: Normal appearance. He is obese. He is not ill-appearing, toxic-appearing or diaphoretic.  HENT:     Head: Normocephalic.     Right Ear: External ear normal.     Left Ear: External ear normal.     Nose: Nose normal. No congestion or rhinorrhea.     Mouth/Throat:     Mouth: Mucous membranes are moist.  Eyes:     General:        Right eye: No discharge.        Left eye: No discharge.      Extraocular Movements: Extraocular movements intact.     Conjunctiva/sclera: Conjunctivae normal.     Pupils: Pupils are equal, round, and reactive to light.  Cardiovascular:     Rate and Rhythm: Normal rate and regular rhythm.     Heart sounds: No murmur heard. Pulmonary:     Effort: Pulmonary effort is normal. No respiratory distress.     Breath sounds: Normal breath sounds. No wheezing, rhonchi or rales.  Abdominal:     General: Abdomen is flat. Bowel sounds are normal.  Musculoskeletal:     Cervical back: Normal range of motion and neck supple.  Skin:    General: Skin is warm and dry.     Capillary Refill: Capillary refill takes less than 2 seconds.  Neurological:     General: No focal deficit present.     Mental Status: He is alert and oriented to person, place, and time.  Psychiatric:        Mood and Affect: Mood normal.        Behavior: Behavior normal.  Thought Content: Thought content normal.        Judgment: Judgment normal.    Results for orders placed or performed in visit on 05/20/21  Lipid Profile  Result Value Ref Range   Cholesterol, Total 173 100 - 199 mg/dL   Triglycerides 149 0 - 149 mg/dL   HDL 34 (L) >39 mg/dL   VLDL Cholesterol Cal 27 5 - 40 mg/dL   LDL Chol Calc (NIH) 112 (H) 0 - 99 mg/dL   Chol/HDL Ratio 5.1 (H) 0.0 - 5.0 ratio  Comp Met (CMET)  Result Value Ref Range   Glucose 83 65 - 99 mg/dL   BUN 10 6 - 24 mg/dL   Creatinine, Ser 1.04 0.76 - 1.27 mg/dL   eGFR 86 >59 mL/min/1.73   BUN/Creatinine Ratio 10 9 - 20   Sodium 142 134 - 144 mmol/L   Potassium 4.4 3.5 - 5.2 mmol/L   Chloride 102 96 - 106 mmol/L   CO2 23 20 - 29 mmol/L   Calcium 9.1 8.7 - 10.2 mg/dL   Total Protein 6.9 6.0 - 8.5 g/dL   Albumin 4.5 3.8 - 4.9 g/dL   Globulin, Total 2.4 1.5 - 4.5 g/dL   Albumin/Globulin Ratio 1.9 1.2 - 2.2   Bilirubin Total 0.5 0.0 - 1.2 mg/dL   Alkaline Phosphatase 139 (H) 44 - 121 IU/L   AST 22 0 - 40 IU/L   ALT 23 0 - 44 IU/L  SAR  CoV2 Serology (COVID 19)AB(IGG)IA  Result Value Ref Range   SARS-CoV-2 Semi-Quant IgG Ab >800.0 Neg <13.0 AU/mL   SARS-CoV-2 Spike Ab Interp Positive   HgB A1c  Result Value Ref Range   Hgb A1c MFr Bld 5.5 4.8 - 5.6 %   Est. average glucose Bld gHb Est-mCnc 111 mg/dL      Assessment & Plan:   Problem List Items Addressed This Visit       Other   Depression, major, single episode, severe ([place]) - Primary    Chronic. Improving. Medication changed at last visit from Celexa to Effexor.  Patient feels like it is improving.  At this point he is having trouble sleeping and wondering if there is anything to help with his sleep.  Will start trazodone 77m daily.  Discussed side effects and benefits of medication with patient today.  Patient does not feel like he needs to increase his Effexor today.  Follow up in 3 months for reevaluation.       Relevant Medications   traZODone (DESYREL) 50 MG tablet   venlafaxine XR (EFFEXOR XR) 37.5 MG 24 hr capsule     Follow up plan: Return in about 3 months (around 09/22/2021) for HTN, HLD, DM2 FU.

## 2021-06-23 NOTE — Assessment & Plan Note (Addendum)
Chronic. Improving. Medication changed at last visit from Celexa to Effexor.  Patient feels like it is improving.  At this point he is having trouble sleeping and wondering if there is anything to help with his sleep.  Will start trazodone 50mg  daily.  Discussed side effects and benefits of medication with patient today.  Patient does not feel like he needs to increase his Effexor today.  Follow up in 3 months for reevaluation.

## 2021-07-18 ENCOUNTER — Other Ambulatory Visit: Payer: Self-pay | Admitting: Nurse Practitioner

## 2021-07-18 NOTE — Telephone Encounter (Signed)
Pt wanted 90 day refill- previous prescription has 3 refills so changed to 90 tabs.

## 2021-09-23 ENCOUNTER — Ambulatory Visit: Payer: Managed Care, Other (non HMO) | Admitting: Nurse Practitioner

## 2021-09-23 ENCOUNTER — Encounter: Payer: Self-pay | Admitting: Nurse Practitioner

## 2021-09-23 ENCOUNTER — Other Ambulatory Visit: Payer: Self-pay

## 2021-09-23 VITALS — BP 114/73 | HR 66 | Temp 98.6°F | Ht 70.39 in | Wt 286.8 lb

## 2021-09-23 DIAGNOSIS — M1A0710 Idiopathic chronic gout, right ankle and foot, without tophus (tophi): Secondary | ICD-10-CM

## 2021-09-23 DIAGNOSIS — Z23 Encounter for immunization: Secondary | ICD-10-CM

## 2021-09-23 DIAGNOSIS — E7800 Pure hypercholesterolemia, unspecified: Secondary | ICD-10-CM

## 2021-09-23 DIAGNOSIS — J449 Chronic obstructive pulmonary disease, unspecified: Secondary | ICD-10-CM

## 2021-09-23 DIAGNOSIS — F322 Major depressive disorder, single episode, severe without psychotic features: Secondary | ICD-10-CM | POA: Diagnosis not present

## 2021-09-23 MED ORDER — INDOMETHACIN 50 MG PO CAPS: 50.0000 mg | ORAL_CAPSULE | Freq: Two times a day (BID) | ORAL | 0 refills | Status: DC | PRN

## 2021-09-23 MED ORDER — OMEPRAZOLE 20 MG PO CPDR: 20.0000 mg | DELAYED_RELEASE_CAPSULE | Freq: Every day | ORAL | 1 refills | Status: DC

## 2021-09-23 MED ORDER — AZITHROMYCIN 250 MG PO TABS: ORAL_TABLET | ORAL | 0 refills | Status: AC

## 2021-09-23 MED ORDER — VENLAFAXINE HCL ER 37.5 MG PO CP24: 37.5000 mg | ORAL_CAPSULE | Freq: Every day | ORAL | 1 refills | Status: DC

## 2021-09-23 MED ORDER — ROSUVASTATIN CALCIUM 5 MG PO TABS: 5.0000 mg | ORAL_TABLET | Freq: Every day | ORAL | 1 refills | Status: DC

## 2021-09-23 MED ORDER — TRAZODONE HCL 50 MG PO TABS: ORAL_TABLET | ORAL | 1 refills | Status: DC

## 2021-09-23 NOTE — Progress Notes (Signed)
BP 114/73   Pulse 66   Temp 98.6 F (37 C) (Oral)   Ht 5' 10.39" (1.788 m)   Wt 286 lb 12.8 oz (130.1 kg)   SpO2 96%   BMI 40.69 kg/m    Subjective:    Patient ID: Walter Gaster., male    DOB: 1965/12/15, 55 y.o.   MRN: [id number]  HPI: Walter Mcclain is a 55 y.o. male  Chief Complaint  Patient presents with   Depression   DEPRESSION Patient states he is doing well. Denies concerns at visit today. Feels like he is sleeping better as well.    COPD Was recently sick.  States it has been a couple of week that his symptoms have been going on.  He is having some SOB when he walks long distances.  COPD status: controlled Satisfied with current treatment?: yes Oxygen use: no Dyspnea frequency: when he walks long distance Cough frequency:  Rescue inhaler frequency:   Limitation of activity: yes Productive cough:  Last Spirometry:  Pneumovax: Up to Date Influenza: Up to Date  HYPERLIPIDEMIA Hyperlipidemia status:  not currently on medication  Satisfied with current treatment?  yes Side effects:  no Medication compliance: excellent compliance Past cholesterol meds: none Supplements: none Aspirin:  no The 10-year ASCVD risk score (Arnett DK, et al., 2019) is: 9.1%   Values used to calculate the score:     Age: 35 years     Sex: Male     Is Non-Hispanic African American: No     Diabetic: No     Tobacco smoker: Yes     Systolic Blood Pressure: 620 mmHg     Is BP treated: No     HDL Cholesterol: 34 mg/dL     Total Cholesterol: 173 mg/dL Chest pain:  no Coronary artery disease:  no Family history CAD:  no Family history early CAD:  no   Relevant past medical, surgical, family and social history reviewed and updated as indicated. Interim medical history since our last visit reviewed. Allergies and medications reviewed and updated.  Review of Systems  HENT:  Positive for congestion.   Eyes:  Negative for visual disturbance.  Respiratory:  Positive  for shortness of breath.   Cardiovascular:  Negative for chest pain and leg swelling.  Neurological:  Negative for light-headedness and headaches.  Psychiatric/Behavioral:  Positive for dysphoric mood. Negative for suicidal ideas. The patient is not nervous/anxious.    Per HPI unless specifically indicated above     Objective:    BP 114/73   Pulse 66   Temp 98.6 F (37 C) (Oral)   Ht 5' 10.39" (1.788 m)   Wt 286 lb 12.8 oz (130.1 kg)   SpO2 96%   BMI 40.69 kg/m   Wt Readings from Last 3 Encounters:  09/23/21 286 lb 12.8 oz (130.1 kg)  06/23/21 283 lb (128.4 kg)  05/20/21 284 lb 3.2 oz (128.9 kg)    Physical Exam Vitals and nursing note reviewed.  Constitutional:      General: He is not in acute distress.    Appearance: Normal appearance. He is obese. He is not ill-appearing, toxic-appearing or diaphoretic.  HENT:     Head: Normocephalic.     Right Ear: External ear normal.     Left Ear: External ear normal.     Nose: Nose normal. No congestion or rhinorrhea.     Mouth/Throat:     Mouth: Mucous membranes are moist.  Eyes:  General:        Right eye: No discharge.        Left eye: No discharge.     Extraocular Movements: Extraocular movements intact.     Conjunctiva/sclera: Conjunctivae normal.     Pupils: Pupils are equal, round, and reactive to light.  Cardiovascular:     Rate and Rhythm: Normal rate and regular rhythm.     Heart sounds: No murmur heard. Pulmonary:     Effort: Pulmonary effort is normal. No respiratory distress.     Breath sounds: Normal breath sounds. No wheezing, rhonchi or rales.  Abdominal:     General: Abdomen is flat. Bowel sounds are normal.  Musculoskeletal:     Cervical back: Normal range of motion and neck supple.  Skin:    General: Skin is warm and dry.     Capillary Refill: Capillary refill takes less than 2 seconds.  Neurological:     General: No focal deficit present.     Mental Status: He is alert and oriented to person,  place, and time.  Psychiatric:        Mood and Affect: Mood normal.        Behavior: Behavior normal.        Thought Content: Thought content normal.        Judgment: Judgment normal.    Results for orders placed or performed in visit on 05/20/21  Lipid Profile  Result Value Ref Range   Cholesterol, Total 173 100 - 199 mg/dL   Triglycerides 149 0 - 149 mg/dL   HDL 34 (L) >39 mg/dL   VLDL Cholesterol Cal 27 5 - 40 mg/dL   LDL Chol Calc (NIH) 112 (H) 0 - 99 mg/dL   Chol/HDL Ratio 5.1 (H) 0.0 - 5.0 ratio  Comp Met (CMET)  Result Value Ref Range   Glucose 83 65 - 99 mg/dL   BUN 10 6 - 24 mg/dL   Creatinine, Ser 1.04 0.76 - 1.27 mg/dL   eGFR 86 >59 mL/min/1.73   BUN/Creatinine Ratio 10 9 - 20   Sodium 142 134 - 144 mmol/L   Potassium 4.4 3.5 - 5.2 mmol/L   Chloride 102 96 - 106 mmol/L   CO2 23 20 - 29 mmol/L   Calcium 9.1 8.7 - 10.2 mg/dL   Total Protein 6.9 6.0 - 8.5 g/dL   Albumin 4.5 3.8 - 4.9 g/dL   Globulin, Total 2.4 1.5 - 4.5 g/dL   Albumin/Globulin Ratio 1.9 1.2 - 2.2   Bilirubin Total 0.5 0.0 - 1.2 mg/dL   Alkaline Phosphatase 139 (H) 44 - 121 IU/L   AST 22 0 - 40 IU/L   ALT 23 0 - 44 IU/L  SAR CoV2 Serology (COVID 19)AB(IGG)IA  Result Value Ref Range   SARS-CoV-2 Semi-Quant IgG Ab >800.0 Neg <13.0 AU/mL   SARS-CoV-2 Spike Ab Interp Positive   HgB A1c  Result Value Ref Range   Hgb A1c MFr Bld 5.5 4.8 - 5.6 %   Est. average glucose Bld gHb Est-mCnc 111 mg/dL      Assessment & Plan:   Problem List Items Addressed This Visit       Respiratory   COPD (chronic obstructive pulmonary disease) (HCC)     Other   Gout   Depression, major, single episode, severe ([place]) - Primary   Other Visit Diagnoses     Hypercholesterolemia       Need for influenza vaccination  Follow up plan: No follow-ups on file.       

## 2021-09-23 NOTE — Assessment & Plan Note (Signed)
Chronic. Exacerbated at this time due to recent illness. Will give Azithromycin to help clear up congestion. Return to clinic if SOB does not improve.  Patient understands and agrees with the plan of care.

## 2021-09-23 NOTE — Assessment & Plan Note (Signed)
Chronic.  Controlled.  Continue with current medication regimen of Indomethacin PRN.  Labs ordered today.  Return to clinic in 6 months for reevaluation.  Call sooner if concerns arise.

## 2021-09-23 NOTE — Assessment & Plan Note (Signed)
Chronic.  Controlled.  Continue with current medication regimen on Effexor 37.5mg .  Feels like this is a good dose for him, does not want to increase at this time.  Labs ordered today.  Return to clinic in 6 months for reevaluation.  Call sooner if concerns arise.

## 2021-09-23 NOTE — Assessment & Plan Note (Signed)
Chronic. Will start Crestor 5mg .  ASCVD score is 9%. Discussed side effects and benefits of medication during visit today.  Will increase to 20mg  as patient tolerates the medication.  Follow up in 6 months. Labs ordered today.

## 2021-09-24 LAB — COMPREHENSIVE METABOLIC PANEL
ALT: 23 IU/L (ref 0–44)
AST: 21 IU/L (ref 0–40)
Albumin/Globulin Ratio: 1.8 (ref 1.2–2.2)
Albumin: 4.6 g/dL (ref 3.8–4.9)
Alkaline Phosphatase: 150 IU/L — ABNORMAL HIGH (ref 44–121)
BUN/Creatinine Ratio: 10 (ref 9–20)
BUN: 10 mg/dL (ref 6–24)
Bilirubin Total: 0.5 mg/dL (ref 0.0–1.2)
CO2: 25 mmol/L (ref 20–29)
Calcium: 9.2 mg/dL (ref 8.7–10.2)
Chloride: 102 mmol/L (ref 96–106)
Creatinine, Ser: 1 mg/dL (ref 0.76–1.27)
Globulin, Total: 2.6 g/dL (ref 1.5–4.5)
Glucose: 83 mg/dL (ref 70–99)
Potassium: 4.5 mmol/L (ref 3.5–5.2)
Sodium: 141 mmol/L (ref 134–144)
Total Protein: 7.2 g/dL (ref 6.0–8.5)
eGFR: 90 mL/min/{1.73_m2} (ref >59)

## 2021-09-24 LAB — LIPID PANEL
Chol/HDL Ratio: 5.4 ratio — ABNORMAL HIGH (ref 0.0–5.0)
Cholesterol, Total: 173 mg/dL (ref 100–199)
HDL: 32 mg/dL — ABNORMAL LOW (ref >39)
LDL Chol Calc (NIH): 113 mg/dL — ABNORMAL HIGH (ref 0–99)
Triglycerides: 157 mg/dL — ABNORMAL HIGH (ref 0–149)
VLDL Cholesterol Cal: 28 mg/dL (ref 5–40)

## 2021-09-24 MED ORDER — ROSUVASTATIN CALCIUM 5 MG PO TABS: 5.0000 mg | ORAL_TABLET | Freq: Every day | ORAL | 1 refills | Status: DC

## 2021-09-24 NOTE — Progress Notes (Signed)
Please let patient know that his lab work shows that his cholesterol is still elevated. I recommend he take the Crestor 5mg  that was prescribed at his visit yesterday.  We will recheck this at his follow up in 6 months.  Please let me know if you have any questions.

## 2021-09-24 NOTE — Addendum Note (Signed)
Addended by: Larae Grooms on: 09/24/2021 01:56 PM   Modules accepted: Orders

## 2021-09-24 NOTE — Progress Notes (Signed)
Medication sent to the pharmacy.

## 2021-09-25 ENCOUNTER — Telehealth: Payer: Self-pay | Admitting: Nurse Practitioner

## 2021-09-25 MED ORDER — ROSUVASTATIN CALCIUM 5 MG PO TABS: 5.0000 mg | ORAL_TABLET | Freq: Every day | ORAL | 3 refills | Status: DC

## 2021-09-25 NOTE — Telephone Encounter (Signed)
Pt is having trouble filling medication for the 90 day supply of rosuvastatin (CRESTOR) 5 MG tablet  at [facility] in [place] / it was then sent to [facility] and he also had issue with [facility]/ they dont accept his insurance / so it will cost over $250 /pt is asking if provider will call in smaller amounts to [facility] in [place] to see if this will be covered / please advise

## 2021-09-25 NOTE — Telephone Encounter (Signed)
[facility] supply [facility] 

## 2021-11-30 ENCOUNTER — Other Ambulatory Visit: Payer: Self-pay

## 2021-11-30 ENCOUNTER — Ambulatory Visit: Payer: Self-pay | Admitting: *Deleted

## 2021-11-30 ENCOUNTER — Emergency Department
Admission: EM | Admit: 2021-11-30 | Discharge: 2021-11-30 | Disposition: A | Discharge status: Home / Self Care | Payer: Managed Care, Other (non HMO) | Attending: Emergency Medicine | Admitting: Emergency Medicine

## 2021-11-30 ENCOUNTER — Emergency Department: Payer: Managed Care, Other (non HMO)

## 2021-11-30 DIAGNOSIS — R001 Bradycardia, unspecified: Secondary | ICD-10-CM | POA: Insufficient documentation

## 2021-11-30 DIAGNOSIS — N200 Calculus of kidney: Secondary | ICD-10-CM

## 2021-11-30 DIAGNOSIS — N132 Hydronephrosis with renal and ureteral calculous obstruction: Secondary | ICD-10-CM | POA: Insufficient documentation

## 2021-11-30 DIAGNOSIS — R109 Unspecified abdominal pain: Secondary | ICD-10-CM | POA: Diagnosis present

## 2021-11-30 LAB — URINALYSIS, ROUTINE W REFLEX MICROSCOPIC
Bacteria, UA: NONE SEEN
Bilirubin Urine: NEGATIVE
Glucose, UA: NEGATIVE mg/dL
Ketones, ur: NEGATIVE mg/dL
Leukocytes,Ua: NEGATIVE
Nitrite: NEGATIVE
Protein, ur: NEGATIVE mg/dL
Specific Gravity, Urine: 1.023 (ref 1.005–1.030)
pH: 5 (ref 5.0–8.0)

## 2021-11-30 LAB — COMPREHENSIVE METABOLIC PANEL
ALT: 20 U/L (ref 0–44)
AST: 20 U/L (ref 15–41)
Albumin: 3.9 g/dL (ref 3.5–5.0)
Alkaline Phosphatase: 124 U/L (ref 38–126)
Anion gap: 7 (ref 5–15)
BUN: 15 mg/dL (ref 6–20)
CO2: 27 mmol/L (ref 22–32)
Calcium: 8.8 mg/dL — ABNORMAL LOW (ref 8.9–10.3)
Chloride: 103 mmol/L (ref 98–111)
Creatinine, Ser: 1.1 mg/dL (ref 0.61–1.24)
GFR, Estimated: >60 mL/min (ref >60)
Glucose, Bld: 110 mg/dL — ABNORMAL HIGH (ref 70–99)
Potassium: 4.4 mmol/L (ref 3.5–5.1)
Sodium: 137 mmol/L (ref 135–145)
Total Bilirubin: 0.7 mg/dL (ref 0.3–1.2)
Total Protein: 7.3 g/dL (ref 6.5–8.1)

## 2021-11-30 LAB — CBC
HCT: 45 % (ref 39.0–52.0)
Hemoglobin: 15.1 g/dL (ref 13.0–17.0)
MCH: 28.7 pg (ref 26.0–34.0)
MCHC: 33.6 g/dL (ref 30.0–36.0)
MCV: 85.6 fL (ref 80.0–100.0)
Platelets: 184 10*3/uL (ref 150–400)
RBC: 5.26 MIL/uL (ref 4.22–5.81)
RDW: 12 % (ref 11.5–15.5)
WBC: 5.3 10*3/uL (ref 4.0–10.5)
nRBC: 0 % (ref 0.0–0.2)

## 2021-11-30 LAB — LIPASE, BLOOD: Lipase: 43 U/L (ref 11–51)

## 2021-11-30 MED ORDER — OXYCODONE HCL 5 MG PO TABS
5.0000 mg | ORAL_TABLET | Freq: Once | ORAL | Status: AC
Start: 2021-11-30 — End: 2021-11-30
  Administered 2021-11-30: 5 mg via ORAL
  Filled 2021-11-30: qty 1

## 2021-11-30 MED ORDER — SODIUM CHLORIDE 0.9 % IV BOLUS
1000.0000 mL | Freq: Once | INTRAVENOUS | Status: AC
  Administered 2021-11-30: 1000 mL via INTRAVENOUS

## 2021-11-30 MED ORDER — KETOROLAC TROMETHAMINE 30 MG/ML IJ SOLN
15.0000 mg | Freq: Once | INTRAMUSCULAR | Status: AC
  Administered 2021-11-30: 15 mg via INTRAVENOUS
  Filled 2021-11-30: qty 1

## 2021-11-30 MED ORDER — ONDANSETRON HCL 4 MG/2ML IJ SOLN
4.0000 mg | Freq: Once | INTRAMUSCULAR | Status: AC
  Administered 2021-11-30: 4 mg via INTRAVENOUS
  Filled 2021-11-30: qty 2

## 2021-11-30 MED ORDER — TAMSULOSIN HCL 0.4 MG PO CAPS: 0.4000 mg | ORAL_CAPSULE | Freq: Every day | ORAL | 0 refills | Status: AC

## 2021-11-30 MED ORDER — HYDROMORPHONE HCL 1 MG/ML IJ SOLN
0.5000 mg | Freq: Once | INTRAMUSCULAR | Status: AC
Start: 2021-11-30 — End: 2021-11-30
  Administered 2021-11-30: 0.5 mg via INTRAVENOUS
  Filled 2021-11-30: qty 1

## 2021-11-30 MED ORDER — HYDROMORPHONE HCL 1 MG/ML IJ SOLN
1.0000 mg | Freq: Once | INTRAMUSCULAR | Status: DC
Start: 2021-11-30 — End: 2021-11-30

## 2021-11-30 MED ORDER — OXYCODONE HCL 5 MG PO TABS: 5.0000 mg | ORAL_TABLET | Freq: Four times a day (QID) | ORAL | 0 refills | Status: AC | PRN

## 2021-11-30 NOTE — Telephone Encounter (Signed)
Reason for Disposition  [1] SEVERE pain (e.g., excruciating) AND [2] present > 1 hour  Answer Assessment - Initial Assessment Questions 1. LOCATION: "Where does it hurt?"      I feel like I have a blockage.   I've been to bathroom 30 times and all I do is dribble.    I keep feeling like I to go.   My right abd and side area.    It hurt yesterday.   I took some Tylenol and It helped.    Has a history of kidney stones.     2. RADIATION: "Does the pain shoot anywhere else?" (e.g., chest, back)     It's in my abd, groin and side area.   Denies flank pain.  No blood in urine.   3. ONSET: "When did the pain begin?" (Minutes, hours or days ago)      Yesterday but it's bad now.   4. SUDDEN: "Gradual or sudden onset?"     *No Answer* 5. PATTERN "Does the pain come and go, or is it constant?"    - If constant: "Is it getting better, staying the same, or worsening?"      (Note: Constant means the pain never goes away completely; most serious pain is constant and it progresses)     - If intermittent: "How long does it last?" "Do you have pain now?"     (Note: Intermittent means the pain goes away completely between bouts)     *No Answer* 6. SEVERITY: "How bad is the pain?"  (e.g., Scale 1-10; mild, moderate, or severe)    - MILD (1-3): doesn't interfere with normal activities, abdomen soft and not tender to touch     - MODERATE (4-7): interferes with normal activities or awakens from sleep, abdomen tender to touch     - SEVERE (8-10): excruciating pain, doubled over, unable to do any normal activities       *No Answer* 7. RECURRENT SYMPTOM: "Have you ever had this type of stomach pain before?" If Yes, ask: "When was the last time?" and "What happened that time?"      *No Answer* 8. CAUSE: "What do you think is causing the stomach pain?"     *No Answer* 9. RELIEVING/AGGRAVATING FACTORS: "What makes it better or worse?" (e.g., movement, antacids, bowel movement)     *No Answer* 10. OTHER SYMPTOMS: "Do  you have any other symptoms?" (e.g., back pain, diarrhea, fever, urination pain, vomiting)       *No Answer*  Protocols used: Abdominal Pain - Male-A-AH

## 2021-11-30 NOTE — Telephone Encounter (Signed)
°  Chief Complaint: Severe right and pain, frequent urination,  unable to empty bladder. Symptoms: Dribbling when urinating.   Going very frequent stills feels like has to go Frequency: 30 times this morning Pertinent Negatives: Patient denies blood in urine.  Does have a history of kidney stones Disposition: [x] ED /[] Urgent Care (no appt availability in office) / [] Appointment(In office/virtual)/ []  [place] [facility]/ [] Home Care/ [] Refused Recommended Disposition /[]  Mobile Bus/ []  Follow-up with PCP Additional Notes:  Notes to to [facility]

## 2021-11-30 NOTE — ED Triage Notes (Signed)
Pt come with c/o abdominal pain for last few days. Pt states it has since gotten worse. Pt states dribbling when urinating.

## 2021-11-30 NOTE — ED Provider Notes (Addendum)
[facility] Provider Note    Event Date/Time   First MD Initiated Contact with Patient 11/30/21 [phone]     (approximate)   History   Abdominal Pain   HPI  Walter Mcclain. is a 56 y.o. male with history of high cholesterol who comes in with concerns for right-sided abdominal pain.  Patient reports having sudden onset of pain this morning with difficulties getting his urination out where he feels like it is just dribbling.  He does report a history of kidney stones requiring stent placement and states that this kind of feels similar.  He reports some associated nausea but no fevers.  No chest pain, shortness of breath.  Physical Exam   Triage Vital Signs: Blood pressure 130/80, pulse (!) 50, temperature 97.7 F (36.5 C), temperature source Oral, resp. rate 14, SpO2 (!) 89 %.  Most recent vital signs: Vitals:   11/30/21 0828 11/30/21 0930  BP:  130/80  Pulse:  (!) 50  Resp:  14  Temp: 97.7 F (36.5 C)   SpO2:  (!) 89%     General: Awake, no distress.  Appears uncomfortable CV:  Good peripheral perfusion.  Resp:  Normal effort.  Abd:  No distention.  Reported right-sided abdominal pain without any rebound or guarding    ED Results / Procedures / Treatments   Labs (all labs ordered are listed, but only abnormal results are displayed) Labs Reviewed  LIPASE, BLOOD  COMPREHENSIVE METABOLIC PANEL  CBC  URINALYSIS, ROUTINE W REFLEX MICROSCOPIC     EKG  My interpretation of EKG: Sinus bradycardia rate of 57 without any ST elevation or T wave inversions, normal intervals   RADIOLOGY I have reviewed the ct personally and agree with radiology read 3.5 mm stone UVJ  3.5 mm stone UVJ  Copy of CT report provided to patient for incidental finding   PROCEDURES:  Critical Care performed: No  .1-3 Lead EKG Interpretation Performed by: Concha Se, MD Authorized by: Concha Se, MD     Interpretation: abnormal     ECG rate:   50   ECG rate assessment: bradycardic     Rhythm: sinus bradycardia     Ectopy: none     Conduction: normal     MEDICATIONS ORDERED IN ED: Medications  sodium chloride 0.9 % bolus 1,000 mL (has no administration in time range)  HYDROmorphone (DILAUDID) injection 1 mg (has no administration in time range)  ondansetron (ZOFRAN) injection 4 mg (has no administration in time range)     IMPRESSION / MDM / ASSESSMENT AND PLAN / ED COURSE  I reviewed the triage vital signs and the nursing notes.                              Patient with high cholesterol and history of kidney stones who comes in with concern for severe right-sided abdominal pain. Differential diagnosis includes, but is not limited to, suspect kidney stone versus bladder retention versus appendicitis.  We will start off with CT renal given high concern for kidney stone.  We will give patient 1 L of fluids, 0.5 mg of Dilaudid and 4 mg of IV Zofran to help with symptoms.  UA without evidence of UTI but does have blood in it concerning for kidney stone Lipase normal CMP normal kidney function CBC normal white count  CT imaging confirms kidney stone   10:00 AM repeat evaluation patient's pain  is much better.    We will give some Toradol and oxycodone.  I reassessed him he has no right upper quadrant pain to suggest cholecystitis and his pain is more in his right lower quadrant and seems to be consistent with a kidney stone.  I did discuss the gallstones noted on CT imaging he is aware of this.  We discussed symptom management of kidney stone and following up with urology and returning for fevers.  He expressed understanding felt comfortable with discharge home  I discussed the provisional nature of ED diagnosis, the treatment so far, the ongoing plan of care, follow up appointments and return precautions with the patient and any family or support people present. They expressed understanding and agreed with the plan, discharged  home.   The patient is on the cardiac monitor to evaluate for evidence of arrhythmia and/or significant heart rate changes.   FINAL CLINICAL IMPRESSION(S) / ED DIAGNOSES   Final diagnoses:  Kidney stone     Rx / DC Orders   ED Discharge Orders          Ordered    tamsulosin (FLOMAX) 0.4 MG CAPS capsule  Daily        11/30/21 1039    oxyCODONE (ROXICODONE) 5 MG immediate release tablet  Every 6 hours PRN        11/30/21 1039             Note:  This document was prepared using Dragon voice recognition software and may include unintentional dictation errors.   Concha Se, MD 11/30/21 1044    Concha Se, MD 11/30/21 1045

## 2021-11-30 NOTE — Discharge Instructions (Addendum)
IMPRESSION: You have a kidney stone. See report below.   Take ibuprofen 600mg  every 8 hours daily (as long as you are not on any other blood thinners or have kidney disease) Take tylenol 1g every 8 hours daily. Take oxycodone for breakthrough pain. Do not drive, work, or operate machinery while on this.  Take zofran to help with nausea. Take Flomax to help dilate uretha. Call urology number above to schedule outpatient appointment. Return to ED for fevers, unable to keep food down, or any other concerns.     1. Mild RIGHT hydroureteronephrosis to the level of a 3.5 mm stone which is either in the UVJ or layering dependently in the urinary bladder. 2. Three additional punctate 1 mm nonobstructive right renal stones. 3. Hepatic steatosis. 4. Cholelithiasis without findings of acute cholecystitis. 5. Aortic Atherosclerosis (ICD10-I70.0).  Take oxycodone as prescribed. Do not drink alcohol, drive or participate in any other potentially dangerous activities while taking this medication as it may make you sleepy. Do not take this medication with any other sedating medications, either prescription or over-the-counter. If you were prescribed Percocet or Vicodin, do not take these with acetaminophen (Tylenol) as it is already contained within these medications.  This medication is an opiate (or narcotic) pain medication and can be habit forming. Use it as little as possible to achieve adequate pain control. Do not use or use it with extreme caution if you have a history of opiate abuse or dependence. If you are on a pain contract with your primary care doctor or a pain specialist, be sure to let them know you were prescribed this medication today from the Emergency Department. This medication is intended for your use only - do not give any to anyone else and keep it in a secure place where nobody else, especially children, have access to it.

## 2022-01-04 ENCOUNTER — Other Ambulatory Visit: Payer: Self-pay | Admitting: Nurse Practitioner

## 2022-01-04 NOTE — Telephone Encounter (Signed)
Medication: rosuvastatin (CRESTOR) 5 MG tablet [[id number]]  ? ?Has the patient contacted their pharmacy? YES ?(Agent: If no, request that the patient contact the pharmacy for the refill. If patient does not wish to contact the pharmacy document the reason why and proceed with request.) ?(Agent: If yes, when and what did the pharmacy advise?) ? ? ?Preferred Pharmacy (with phone number or street name): [id number] - [place], [place] - [address] [place] [address] [place] [address] ?[address] [place] [address] [place] [address] ?Phone: [phone] Fax: [phone] ?Hours: Not open 24 hours ? ?Has the patient been seen for an appointment in the last year OR does the patient have an upcoming appointment? YES 09/23/21 ? ?Agent: Please be advised that RX refills may take up to 3 business days. We ask that you follow-up with your pharmacy. ?

## 2022-01-06 MED ORDER — ROSUVASTATIN CALCIUM 5 MG PO TABS: 5.0000 mg | ORAL_TABLET | Freq: Every day | ORAL | 3 refills | Status: DC

## 2022-01-06 NOTE — Telephone Encounter (Signed)
Requested Prescriptions  ?Pending Prescriptions Disp Refills  ?? rosuvastatin (CRESTOR) 5 MG tablet 30 tablet 3  ?  Sig: Take 1 tablet (5 mg total) by mouth daily.  ?  ? Cardiovascular:  Antilipid - Statins 2 Failed - 01/04/2022  5:31 PM  ?  ?  Failed - Lipid Panel in normal range within the last 12 months  ?  Cholesterol, Total  ?Date Value Ref Range Status  ?09/23/2021 173 100 - 199 mg/dL Final  ? ?LDL Chol Calc (NIH)  ?Date Value Ref Range Status  ?09/23/2021 113 (H) 0 - 99 mg/dL Final  ? ?HDL  ?Date Value Ref Range Status  ?09/23/2021 32 (L) >39 mg/dL Final  ? ?Triglycerides  ?Date Value Ref Range Status  ?09/23/2021 157 (H) 0 - 149 mg/dL Final  ? ?  ?  ?  Passed - Cr in normal range and within 360 days  ?  Creatinine  ?Date Value Ref Range Status  ?04/16/2013 1.18 0.60 - 1.30 mg/dL Final  ? ?Creatinine, Ser  ?Date Value Ref Range Status  ?11/30/2021 1.10 0.61 - 1.24 mg/dL Final  ?   ?  ?  Passed - Patient is not pregnant  ?  ?  Passed - Valid encounter within last 12 months  ?  Recent Outpatient Visits   ?      ? 3 months ago Depression, major, single episode, severe (HCC)  ? [facility] Larae Grooms, NP  ? 6 months ago Depression, major, single episode, severe (HCC)  ? [facility] Larae Grooms, NP  ? 7 months ago Chronic obstructive pulmonary disease, unspecified COPD type (HCC)  ? [facility] Larae Grooms, NP  ? 1 year ago Depression, major, single episode, severe (HCC)  ? [facility] [place], Basile T, NP  ? 1 year ago Depression, major, single episode, severe (HCC)  ? [facility] Caro Laroche, DO  ?  ?  ?Future Appointments   ?        ? In 2 months Larae Grooms, NP [facility]  ?  ? ?  ?  ?  ? ?

## 2022-03-24 ENCOUNTER — Ambulatory Visit: Payer: Managed Care, Other (non HMO) | Admitting: Nurse Practitioner

## 2022-03-28 NOTE — Progress Notes (Unsigned)
There were no vitals taken for this visit.   Subjective:    Patient ID: Walter Carrow., male    DOB: 02-11-1966, 57 y.o.   MRN: [id number]  HPI: Walter Zellars. is a 56 y.o. male  No chief complaint on file.  DEPRESSION Patient states he is doing well. Denies concerns at visit today. Feels like he is sleeping better as well.    COPD Was recently sick.  States it has been a couple of week that his symptoms have been going on.  He is having some SOB when he walks long distances.  COPD status: controlled Satisfied with current treatment?: yes Oxygen use: no Dyspnea frequency: when he walks long distance Cough frequency:  Rescue inhaler frequency:   Limitation of activity: yes Productive cough:  Last Spirometry:  Pneumovax: Up to Date Influenza: Up to Date  HYPERLIPIDEMIA Hyperlipidemia status:  not currently on medication  Satisfied with current treatment?  yes Side effects:  no Medication compliance: excellent compliance Past cholesterol meds: none Supplements: none Aspirin:  no The 10-year ASCVD risk score (Arnett DK, et al., 2019) is: 12.6%   Values used to calculate the score:     Age: 42 years     Sex: Male     Is Non-Hispanic African American: No     Diabetic: No     Tobacco smoker: Yes     Systolic Blood Pressure: 130 mmHg     Is BP treated: No     HDL Cholesterol: 32 mg/dL     Total Cholesterol: 173 mg/dL Chest pain:  no Coronary artery disease:  no Family history CAD:  no Family history early CAD:  no   Relevant past medical, surgical, family and social history reviewed and updated as indicated. Interim medical history since our last visit reviewed. Allergies and medications reviewed and updated.  Review of Systems  HENT:  Positive for congestion.   Eyes:  Negative for visual disturbance.  Respiratory:  Positive for shortness of breath.   Cardiovascular:  Negative for chest pain and leg swelling.  Neurological:  Negative for  light-headedness and headaches.  Psychiatric/Behavioral:  Positive for dysphoric mood. Negative for suicidal ideas. The patient is not nervous/anxious.    Per HPI unless specifically indicated above     Objective:    There were no vitals taken for this visit.  Wt Readings from Last 3 Encounters:  09/23/21 286 lb 12.8 oz (130.1 kg)  06/23/21 283 lb (128.4 kg)  05/20/21 284 lb 3.2 oz (128.9 kg)    Physical Exam Vitals and nursing note reviewed.  Constitutional:      General: He is not in acute distress.    Appearance: Normal appearance. He is obese. He is not ill-appearing, toxic-appearing or diaphoretic.  HENT:     Head: Normocephalic.     Right Ear: External ear normal.     Left Ear: External ear normal.     Nose: Nose normal. No congestion or rhinorrhea.     Mouth/Throat:     Mouth: Mucous membranes are moist.  Eyes:     General:        Right eye: No discharge.        Left eye: No discharge.     Extraocular Movements: Extraocular movements intact.     Conjunctiva/sclera: Conjunctivae normal.     Pupils: Pupils are equal, round, and reactive to light.  Cardiovascular:     Rate and Rhythm: Normal rate and regular rhythm.  Heart sounds: No murmur heard. Pulmonary:     Effort: Pulmonary effort is normal. No respiratory distress.     Breath sounds: Normal breath sounds. No wheezing, rhonchi or rales.  Abdominal:     General: Abdomen is flat. Bowel sounds are normal.  Musculoskeletal:     Cervical back: Normal range of motion and neck supple.  Skin:    General: Skin is warm and dry.     Capillary Refill: Capillary refill takes less than 2 seconds.  Neurological:     General: No focal deficit present.     Mental Status: He is alert and oriented to person, place, and time.  Psychiatric:        Mood and Affect: Mood normal.        Behavior: Behavior normal.        Thought Content: Thought content normal.        Judgment: Judgment normal.    Results for orders placed  or performed during the hospital encounter of 11/30/21  Lipase, blood  Result Value Ref Range   Lipase 43 11 - 51 U/L  Comprehensive metabolic panel  Result Value Ref Range   Sodium 137 135 - 145 mmol/L   Potassium 4.4 3.5 - 5.1 mmol/L   Chloride 103 98 - 111 mmol/L   CO2 27 22 - 32 mmol/L   Glucose, Bld 110 (H) 70 - 99 mg/dL   BUN 15 6 - 20 mg/dL   Creatinine, Ser 9.62 0.61 - 1.24 mg/dL   Calcium 8.8 (L) 8.9 - 10.3 mg/dL   Total Protein 7.3 6.5 - 8.1 g/dL   Albumin 3.9 3.5 - 5.0 g/dL   AST 20 15 - 41 U/L   ALT 20 0 - 44 U/L   Alkaline Phosphatase 124 38 - 126 U/L   Total Bilirubin 0.7 0.3 - 1.2 mg/dL   GFR, Estimated >22 >97 mL/min   Anion gap 7 5 - 15  CBC  Result Value Ref Range   WBC 5.3 4.0 - 10.5 K/uL   RBC 5.26 4.22 - 5.81 MIL/uL   Hemoglobin 15.1 13.0 - 17.0 g/dL   HCT 98.9 21.1 - 94.1 %   MCV 85.6 80.0 - 100.0 fL   MCH 28.7 26.0 - 34.0 pg   MCHC 33.6 30.0 - 36.0 g/dL   RDW 74.0 81.4 - 48.1 %   Platelets 184 150 - 400 K/uL   nRBC 0.0 0.0 - 0.2 %  Urinalysis, Routine w reflex microscopic  Result Value Ref Range   Color, Urine YELLOW (A) YELLOW   APPearance CLEAR (A) CLEAR   Specific Gravity, Urine 1.023 1.005 - 1.030   pH 5.0 5.0 - 8.0   Glucose, UA NEGATIVE NEGATIVE mg/dL   Hgb urine dipstick MODERATE (A) NEGATIVE   Bilirubin Urine NEGATIVE NEGATIVE   Ketones, ur NEGATIVE NEGATIVE mg/dL   Protein, ur NEGATIVE NEGATIVE mg/dL   Nitrite NEGATIVE NEGATIVE   Leukocytes,Ua NEGATIVE NEGATIVE   RBC / HPF 11-20 0 - 5 RBC/hpf   WBC, UA 0-5 0 - 5 WBC/hpf   Bacteria, UA NONE SEEN NONE SEEN   Squamous Epithelial / LPF 0-5 0 - 5   Mucus PRESENT       Assessment & Plan:   Problem List Items Addressed This Visit      Respiratory   COPD (chronic obstructive pulmonary disease) (HCC) - Primary     Other   Depression, major, single episode, severe (HCC)   Hypercholesterolemia     Follow  up plan: No follow-ups on file.

## 2022-03-29 ENCOUNTER — Encounter: Payer: Self-pay | Admitting: Nurse Practitioner

## 2022-03-29 ENCOUNTER — Ambulatory Visit (INDEPENDENT_AMBULATORY_CARE_PROVIDER_SITE_OTHER): Payer: Managed Care, Other (non HMO) | Admitting: Nurse Practitioner

## 2022-03-29 VITALS — BP 131/76 | HR 55 | Temp 97.7°F | Wt 302.4 lb

## 2022-03-29 DIAGNOSIS — F322 Major depressive disorder, single episode, severe without psychotic features: Secondary | ICD-10-CM | POA: Diagnosis not present

## 2022-03-29 DIAGNOSIS — J449 Chronic obstructive pulmonary disease, unspecified: Secondary | ICD-10-CM

## 2022-03-29 DIAGNOSIS — E7800 Pure hypercholesterolemia, unspecified: Secondary | ICD-10-CM

## 2022-03-29 MED ORDER — ROSUVASTATIN CALCIUM 5 MG PO TABS: 5.0000 mg | ORAL_TABLET | Freq: Every day | ORAL | 1 refills | Status: DC

## 2022-03-29 NOTE — Assessment & Plan Note (Signed)
Chronic.  Controlled.  Continue with current medication regimen of Effexor 37.5mg  daily.  Will draw labs at next visit.  Return to clinic in 6 weeks for reevaluation.  Call sooner if concerns arise.

## 2022-04-24 ENCOUNTER — Other Ambulatory Visit: Payer: Self-pay | Admitting: Nurse Practitioner

## 2022-04-26 NOTE — Telephone Encounter (Signed)
Requested Prescriptions  Pending Prescriptions Disp Refills  . indomethacin (INDOCIN) 50 MG capsule [Pharmacy Med Name: INDOMETHACIN 50 MG CAPSULE] 60 capsule 2    Sig: TAKE 1 CAPSULE (50 MG TOTAL) BY MOUTH 2 (TWO) TIMES DAILY AS NEEDED. FOR GOUT FLARES     Analgesics:  NSAIDS Failed - 04/24/2022 11:11 AM      Failed - Manual Review: Labs are only required if the patient has taken medication for more than 8 weeks.      Passed - Cr in normal range and within 360 days    Creatinine  Date Value Ref Range Status  04/16/2013 1.18 0.60 - 1.30 mg/dL Final   Creatinine, Ser  Date Value Ref Range Status  11/30/2021 1.10 0.61 - 1.24 mg/dL Final         Passed - HGB in normal range and within 360 days    Hemoglobin  Date Value Ref Range Status  11/30/2021 15.1 13.0 - 17.0 g/dL Final  05/08/2020 15.7 13.0 - 17.7 g/dL Final         Passed - PLT in normal range and within 360 days    Platelets  Date Value Ref Range Status  11/30/2021 184 150 - 400 K/uL Final  05/08/2020 204 150 - 450 x10E3/uL Final         Passed - HCT in normal range and within 360 days    HCT  Date Value Ref Range Status  11/30/2021 45.0 39.0 - 52.0 % Final   Hematocrit  Date Value Ref Range Status  05/08/2020 45.8 37.5 - 51.0 % Final         Passed - eGFR is 30 or above and within 360 days    EGFR (African American)  Date Value Ref Range Status  04/16/2013 >60  Final   GFR calc Af Amer  Date Value Ref Range Status  05/08/2020 93 >59 mL/min/1.73 Final    Comment:    **[facility] currently reports eGFR in compliance with the current**   recommendations of the [employer]. [facility] will   update reporting as new guidelines are published from the NKF-ASN   Task force.    EGFR (Non-African Amer.)  Date Value Ref Range Status  04/16/2013 >60  Final    Comment:    eGFR values <74m/min/1.73 m2 may be an indication of chronic kidney disease (CKD). Calculated eGFR is useful in patients with stable  renal function. The eGFR calculation will not be reliable in acutely ill patients when serum creatinine is changing rapidly. It is not useful in  patients on dialysis. The eGFR calculation may not be applicable to patients at the low and high extremes of body sizes, pregnant women, and vegetarians.    GFR, Estimated  Date Value Ref Range Status  11/30/2021 >60 >60 mL/min Final    Comment:    (NOTE) Calculated using the CKD-EPI Creatinine Equation (2021)    eGFR  Date Value Ref Range Status  09/23/2021 90 >59 mL/min/1.73 Final         Passed - Patient is not pregnant      Passed - Valid encounter within last 12 months    Recent Outpatient Visits          4 weeks ago Depression, major, single episode, severe ([facility]   [place], NP   7 months ago Depression, major, single episode, severe [place] [place]   [place]   10 months ago Depression, major, single episode,  severe [facility])   [facility] Jon Billings, NP   11 months ago Chronic obstructive pulmonary disease, unspecified COPD type ([place])   [place] Jon Billings, NP   1 year ago Depression, major, single episode, severe ([place])   [place], Jolene T, NP      Future Appointments            In 2 weeks Jon Billings, NP [facility] [place]

## 2022-05-07 NOTE — Progress Notes (Deleted)
There were no vitals taken for this visit.   Subjective:    Patient ID: Walter Carrow., male    DOB: June 22, 1966, 56 y.o.   MRN: [id number]  HPI: Walter Glick. is a 56 y.o. male presenting on 05/10/2022 for comprehensive medical examination. Current medical complaints include:{Blank single:19197::"none","***"}  He currently lives with: Interim Problems from his last visit: {Blank single:19197::"yes","no"}  COPD COPD status: {Blank single:19197::"controlled","uncontrolled","better","worse","exacerbated","stable"} Satisfied with current treatment?: {Blank single:19197::"yes","no"} Oxygen use: {Blank single:19197::"yes","no"} Dyspnea frequency:  Cough frequency:  Rescue inhaler frequency:   Limitation of activity: {Blank single:19197::"yes","no"} Productive cough:  Last Spirometry:  Pneumovax: {Blank single:19197::"Up to Date","Not up to Date","unknown"} Influenza: {Blank single:19197::"Up to Date","Not up to Date","unknown"}  HYPERLIPIDEMIA Hyperlipidemia status: {Blank single:19197::"excellent compliance","good compliance","fair compliance","poor compliance"} Satisfied with current treatment?  {Blank single:19197::"yes","no"} Side effects:  {Blank single:19197::"yes","no"} Medication compliance: {Blank single:19197::"excellent compliance","good compliance","fair compliance","poor compliance"} Past cholesterol meds: {Blank multiple:19196::"none","atorvastain (lipitor)","lovastatin (mevacor)","pravastatin (pravachol)","rosuvastatin (crestor)","simvastatin (zocor)","vytorin","fenofibrate (tricor)","gemfibrozil","ezetimide (zetia)","niaspan","lovaza"} Supplements: {Blank multiple:19196::"none","fish oil","niacin","red yeast rice"} Aspirin:  {Blank single:19197::"yes","no"} The 10-year ASCVD risk score (Arnett DK, et al., 2019) is: 12.7%   Values used to calculate the score:     Age: 77 years     Sex: Male     Is Non-Hispanic African American: No     Diabetic: No      Tobacco smoker: Yes     Systolic Blood Pressure: 131 mmHg     Is BP treated: No     HDL Cholesterol: 32 mg/dL     Total Cholesterol: 173 mg/dL Chest pain:  {Blank GYKZLD:35701::"XBL","TJ"} Coronary artery disease:  {Blank single:19197::"yes","no"} Family history CAD:  {Blank single:19197::"yes","no"} Family history early CAD:  {Blank single:19197::"yes","no"}  MOOD  Depression Screen done today and results listed below:     03/29/2022   10:52 AM 09/23/2021    1:47 PM 06/23/2021    9:50 AM 05/20/2021   10:08 AM 11/19/2020   10:35 AM  Depression screen PHQ 2/9  Decreased Interest 1 2 1 1  0  Down, Depressed, Hopeless 1 2 1 2  0  PHQ - 2 Score 2 4 2 3  0  Altered sleeping 0 2 0 3 0  Tired, decreased energy 0 2 0 2 0  Change in appetite 0 1 1 1  0  Feeling bad or failure about yourself  0 2 1 2  0  Trouble concentrating 0 1 0 1 0  Moving slowly or fidgety/restless 0 1 0 1 0  Suicidal thoughts 0 0 0 1 0  PHQ-9 Score 2 13 4 14  0  Difficult doing work/chores Not difficult at all  Not difficult at all Very difficult     The patient {has/does not a history of falls. I {did/did not:19850} complete a risk assessment for falls. A plan of care for falls {was/was not:19852} documented.   Past Medical History:  Past Medical History:  Diagnosis Date   Anxiety    COPD (chronic obstructive pulmonary disease) (HCC)    Depression    Gout     Surgical History:  Past Surgical History:  Procedure Laterality Date   COLONOSCOPY WITH PROPOFOL N/A 06/30/2020   Procedure: COLONOSCOPY WITH PROPOFOL;  Surgeon: , MD;  Location: [facility] ENDOSCOPY;  Service: Gastroenterology;  Laterality: N/A;   ELBOW SURGERY Right    ESOPHAGOGASTRODUODENOSCOPY ENDOSCOPY      Medications:  Current Outpatient Medications on File Prior to Visit  Medication Sig   indomethacin (INDOCIN) 50 MG capsule TAKE 1 CAPSULE (50 MG TOTAL) BY MOUTH 2 (  TWO) TIMES DAILY AS NEEDED. FOR GOUT FLARES   meloxicam  (MOBIC) 15 MG tablet Take 15 mg by mouth daily.   omeprazole (PRILOSEC) 20 MG capsule Take 1 capsule (20 mg total) by mouth daily.   rosuvastatin (CRESTOR) 5 MG tablet Take 1 tablet (5 mg total) by mouth daily.   traZODone (DESYREL) 50 MG tablet TAKE 0.5-1 TABLETS BY MOUTH AT BEDTIME AS NEEDED FOR SLEEP.   venlafaxine XR (EFFEXOR XR) 37.5 MG 24 hr capsule Take 1 capsule (37.5 mg total) by mouth daily with breakfast.   No current facility-administered medications on file prior to visit.    Allergies:  Allergies  Allergen Reactions   Chantix [Varenicline] Other (See Comments)    Lip swelling    Social History:  Social History   Socioeconomic History   Marital status: Married    Spouse name: Not on file   Number of children: Not on file   Years of education: Not on file   Highest education level: Not on file  Occupational History   Not on file  Tobacco Use   Smoking status: Former    Packs/day: 1.00    Types: Cigarettes    Quit date: 01/16/2021    Years since quitting: 1.3   Smokeless tobacco: Former  Building services engineer Use: Never used  Substance and Sexual Activity   Alcohol use: No   Drug use: Not Currently    Types: Marijuana    Comment: Patient states he does a "Gummy" occasionally   Sexual activity: Yes  Other Topics Concern   Not on file  Social History Narrative   Not on file   Social Determinants of Health   Financial Resource Strain: Not on file  Food Insecurity: Not on file  Transportation Needs: Not on file  Physical Activity: Not on file  Stress: Not on file  Social Connections: Not on file  Intimate Partner Violence: Not on file   Social History   Tobacco Use  Smoking Status Former   Packs/day: 1.00   Types: Cigarettes   Quit date: 01/16/2021   Years since quitting: 1.3  Smokeless Tobacco Former   Social History   Substance and Sexual Activity  Alcohol Use No    Family History:  Family History  Problem Relation Age of Onset   Brain  cancer Mother    Liver disease Father    Heart disease Maternal Grandmother    Liver disease Paternal Grandmother    Other Paternal Grandfather        MVA    Past medical history, surgical history, medications, allergies, family history and social history reviewed with patient today and changes made to appropriate areas of the chart.   ROS All other ROS negative except what is listed above and in the HPI.      Objective:    There were no vitals taken for this visit.  Wt Readings from Last 3 Encounters:  03/29/22 (!) 302 lb 6.4 oz (137.2 kg)  09/23/21 286 lb 12.8 oz (130.1 kg)  06/23/21 283 lb (128.4 kg)    Physical Exam  Results for orders placed or performed during the hospital encounter of 11/30/21  Lipase, blood  Result Value Ref Range   Lipase 43 11 - 51 U/L  Comprehensive metabolic panel  Result Value Ref Range   Sodium 137 135 - 145 mmol/L   Potassium 4.4 3.5 - 5.1 mmol/L   Chloride 103 98 - 111 mmol/L   CO2 27 22 -  32 mmol/L   Glucose, Bld 110 (H) 70 - 99 mg/dL   BUN 15 6 - 20 mg/dL   Creatinine, Ser 4.85 0.61 - 1.24 mg/dL   Calcium 8.8 (L) 8.9 - 10.3 mg/dL   Total Protein 7.3 6.5 - 8.1 g/dL   Albumin 3.9 3.5 - 5.0 g/dL   AST 20 15 - 41 U/L   ALT 20 0 - 44 U/L   Alkaline Phosphatase 124 38 - 126 U/L   Total Bilirubin 0.7 0.3 - 1.2 mg/dL   GFR, Estimated >46 >27 mL/min   Anion gap 7 5 - 15  CBC  Result Value Ref Range   WBC 5.3 4.0 - 10.5 K/uL   RBC 5.26 4.22 - 5.81 MIL/uL   Hemoglobin 15.1 13.0 - 17.0 g/dL   HCT 03.5 00.9 - 38.1 %   MCV 85.6 80.0 - 100.0 fL   MCH 28.7 26.0 - 34.0 pg   MCHC 33.6 30.0 - 36.0 g/dL   RDW 82.9 93.7 - 16.9 %   Platelets 184 150 - 400 K/uL   nRBC 0.0 0.0 - 0.2 %  Urinalysis, Routine w reflex microscopic  Result Value Ref Range   Color, Urine YELLOW (A) YELLOW   APPearance CLEAR (A) CLEAR   Specific Gravity, Urine 1.023 1.005 - 1.030   pH 5.0 5.0 - 8.0   Glucose, UA NEGATIVE NEGATIVE mg/dL   Hgb urine dipstick MODERATE  (A) NEGATIVE   Bilirubin Urine NEGATIVE NEGATIVE   Ketones, ur NEGATIVE NEGATIVE mg/dL   Protein, ur NEGATIVE NEGATIVE mg/dL   Nitrite NEGATIVE NEGATIVE   Leukocytes,Ua NEGATIVE NEGATIVE   RBC / HPF 11-20 0 - 5 RBC/hpf   WBC, UA 0-5 0 - 5 WBC/hpf   Bacteria, UA NONE SEEN NONE SEEN   Squamous Epithelial / LPF 0-5 0 - 5   Mucus PRESENT       Assessment & Plan:   Problem List Items Addressed This Visit   None    Discussed aspirin prophylaxis for myocardial infarction prevention and decision was {Blank single:19197::"it was not indicated","made to continue ASA","made to start ASA","made to stop ASA","that we recommended ASA, and patient refused"}  LABORATORY TESTING:  Health maintenance labs ordered today as discussed above.   The natural history of prostate cancer and ongoing controversy regarding screening and potential treatment outcomes of prostate cancer has been discussed with the patient. The meaning of a false positive PSA and a false negative PSA has been discussed. He indicates understanding of the limitations of this screening test and wishes *** to proceed with screening PSA testing.   IMMUNIZATIONS:   - Tdap: Tetanus vaccination status reviewed: {tetanus status:315746}. - Influenza: {Blank single:19197::"Up to date","Administered today","Postponed to flu season","Refused","Given elsewhere"} - Pneumovax: {Blank single:19197::"Up to date","Administered today","Not applicable","Refused","Given elsewhere"} - Prevnar: {Blank single:19197::"Up to date","Administered today","Not applicable","Refused","Given elsewhere"} - COVID: {Blank single:19197::"Up to date","Administered today","Not applicable","Refused","Given elsewhere"} - HPV: {Blank single:19197::"Up to date","Administered today","Not applicable","Refused","Given elsewhere"} - Shingrix vaccine: {Blank single:19197::"Up to date","Administered today","Not applicable","Refused","Given elsewhere"}  SCREENING: - Colonoscopy:  {Blank single:19197::"Up to date","Ordered today","Not applicable","Refused","Done elsewhere"}  Discussed with patient purpose of the colonoscopy is to detect colon cancer at curable precancerous or early stages   - AAA Screening: {Blank single:19197::"Up to date","Ordered today","Not applicable","Refused","Done elsewhere"}  -Hearing Test: {Blank single:19197::"Up to date","Ordered today","Not applicable","Refused","Done elsewhere"}  -Spirometry: {Blank single:19197::"Up to date","Ordered today","Not applicable","Refused","Done elsewhere"}   PATIENT COUNSELING:    Sexuality: Discussed sexually transmitted diseases, partner selection, use of condoms, avoidance of unintended pregnancy  and contraceptive alternatives.  Advised to avoid cigarette smoking.  I discussed with the patient that most people either abstain from alcohol or drink within safe limits (<=14/week and <=4 drinks/occasion for males, <=7/weeks and <= 3 drinks/occasion for females) and that the risk for alcohol disorders and other health effects rises proportionally with the number of drinks per week and how often a drinker exceeds daily limits.  Discussed cessation/primary prevention of drug use and availability of treatment for abuse.   Diet: Encouraged to adjust caloric intake to maintain  or achieve ideal body weight, to reduce intake of dietary saturated fat and total fat, to limit sodium intake by avoiding high sodium foods and not adding table salt, and to maintain adequate dietary potassium and calcium preferably from fresh fruits, vegetables, and low-fat dairy products.    stressed the importance of regular exercise  Injury prevention: Discussed safety belts, safety helmets, smoke detector, smoking near bedding or upholstery.   Dental health: Discussed importance of regular tooth brushing, flossing, and dental visits.   Follow up plan: NEXT PREVENTATIVE PHYSICAL DUE IN 1 YEAR. No follow-ups on file.

## 2022-05-10 ENCOUNTER — Encounter: Payer: Managed Care, Other (non HMO) | Admitting: Nurse Practitioner

## 2022-05-10 DIAGNOSIS — Z0000 Encounter for general adult medical examination without abnormal findings: Secondary | ICD-10-CM

## 2022-05-10 DIAGNOSIS — E7800 Pure hypercholesterolemia, unspecified: Secondary | ICD-10-CM

## 2022-05-10 DIAGNOSIS — J449 Chronic obstructive pulmonary disease, unspecified: Secondary | ICD-10-CM

## 2022-05-10 DIAGNOSIS — F322 Major depressive disorder, single episode, severe without psychotic features: Secondary | ICD-10-CM

## 2022-05-12 ENCOUNTER — Encounter: Payer: Self-pay | Admitting: Nurse Practitioner

## 2022-05-12 ENCOUNTER — Ambulatory Visit (INDEPENDENT_AMBULATORY_CARE_PROVIDER_SITE_OTHER): Payer: Managed Care, Other (non HMO) | Admitting: Nurse Practitioner

## 2022-05-12 VITALS — BP 136/85 | HR 61 | Temp 97.7°F | Ht 72.0 in | Wt 302.4 lb

## 2022-05-12 DIAGNOSIS — F322 Major depressive disorder, single episode, severe without psychotic features: Secondary | ICD-10-CM

## 2022-05-12 DIAGNOSIS — J449 Chronic obstructive pulmonary disease, unspecified: Secondary | ICD-10-CM

## 2022-05-12 DIAGNOSIS — Z0289 Encounter for other administrative examinations: Secondary | ICD-10-CM

## 2022-05-12 DIAGNOSIS — Z23 Encounter for immunization: Secondary | ICD-10-CM | POA: Diagnosis not present

## 2022-05-12 DIAGNOSIS — Z0000 Encounter for general adult medical examination without abnormal findings: Secondary | ICD-10-CM | POA: Diagnosis not present

## 2022-05-12 DIAGNOSIS — Z6841 Body Mass Index (BMI) 40.0 and over, adult: Secondary | ICD-10-CM

## 2022-05-12 DIAGNOSIS — E7800 Pure hypercholesterolemia, unspecified: Secondary | ICD-10-CM

## 2022-05-12 LAB — URINALYSIS, ROUTINE W REFLEX MICROSCOPIC
Bilirubin, UA: NEGATIVE
Glucose, UA: NEGATIVE
Ketones, UA: NEGATIVE
Leukocytes,UA: NEGATIVE
Nitrite, UA: NEGATIVE
Protein,UA: NEGATIVE
Specific Gravity, UA: >1.03 — ABNORMAL HIGH (ref 1.005–1.030)
Urobilinogen, Ur: 0.2 mg/dL (ref 0.2–1.0)
pH, UA: 5.5 (ref 5.0–7.5)

## 2022-05-12 LAB — MICROSCOPIC EXAMINATION
Bacteria, UA: NONE SEEN
WBC, UA: NONE SEEN /hpf (ref 0–5)

## 2022-05-12 MED ORDER — OMEPRAZOLE 20 MG PO CPDR: 20.0000 mg | DELAYED_RELEASE_CAPSULE | Freq: Every day | ORAL | 1 refills | Status: DC

## 2022-05-12 MED ORDER — INDOMETHACIN 50 MG PO CAPS: 50.0000 mg | ORAL_CAPSULE | Freq: Two times a day (BID) | ORAL | 2 refills | Status: DC | PRN

## 2022-05-12 MED ORDER — ROSUVASTATIN CALCIUM 10 MG PO TABS: 10.0000 mg | ORAL_TABLET | Freq: Every day | ORAL | 1 refills | Status: DC

## 2022-05-12 NOTE — Progress Notes (Signed)
BP 136/85   Pulse 61   Temp 97.7 F (36.5 C) (Oral)   Ht 6' (1.829 m)   Wt (!) 302 lb 6.4 oz (137.2 kg)   SpO2 97%   BMI 41.01 kg/m    Subjective:    Patient ID: Walter Carrow., male    DOB: [birth year], 56 y.o.   MRN: [id number]  HPI: Walter Sorter. is a 56 y.o. male presenting on 05/12/2022 for comprehensive medical examination. Current medical complaints include:none  He currently lives with: Interim Problems from his last visit: no  COPD COPD status: controlled Satisfied with current treatment?: yes Oxygen use: no Dyspnea frequency: occasional Cough frequency: no Rescue inhaler frequency:  none Limitation of activity:  sometimes Productive cough:  Last Spirometry:  Pneumovax: Up to Date Influenza: Up to Date  HYPERLIPIDEMIA Hyperlipidemia status: excellent compliance Satisfied with current treatment? yes Side effects:  no Medication compliance: excellent compliance Past cholesterol meds: rosuvastatin (crestor) Supplements: none Aspirin:  no The 10-year ASCVD risk score (Arnett DK, et al., 2019) is: 13.6%   Values used to calculate the score:     Age: 23 years     Sex: Male     Is Non-Hispanic African American: No     Diabetic: No     Tobacco smoker: Yes     Systolic Blood Pressure: 136 mmHg     Is BP treated: No     HDL Cholesterol: 32 mg/dL     Total Cholesterol: 173 mg/dL Chest pain:  no Coronary artery disease:  no Family history CAD:  no Family history early CAD:  no  MOOD Feels like his mood is well controlled.  Does not feel like he needs the Effexor or trazodone. Denies SI. Denies concerns at visit today.   Depression Screen done today and results listed below:     05/12/2022    1:43 PM 03/29/2022   10:52 AM 09/23/2021    1:47 PM 06/23/2021    9:50 AM 05/20/2021   10:08 AM  Depression screen PHQ 2/9  Decreased Interest 2 1 2 1 1   Down, Depressed, Hopeless 1 1 2 1 2   PHQ - 2 Score 3 2 4 2 3   Altered sleeping 1 0 2 0 3  Tired,  decreased energy 1 0 2 0 2  Change in appetite 0 0 1 1 1   Feeling bad or failure about yourself  1 0 2 1 2   Trouble concentrating 1 0 1 0 1  Moving slowly or fidgety/restless 1 0 1 0 1  Suicidal thoughts 1 0 0 0 1  PHQ-9 Score 9 2 13 4 14   Difficult doing work/chores Not difficult at all Not difficult at all  Not difficult at all Very difficult    The patient does not have a history of falls. I did complete a risk assessment for falls. A plan of care for falls was documented.   Past Medical History:  Past Medical History:  Diagnosis Date   Anxiety    COPD (chronic obstructive pulmonary disease) (HCC)    Depression    Gout     Surgical History:  Past Surgical History:  Procedure Laterality Date   COLONOSCOPY WITH PROPOFOL N/A 06/30/2020   Procedure: COLONOSCOPY WITH PROPOFOL;  Surgeon: , MD;  Location: [facility] ENDOSCOPY;  Service: Gastroenterology;  Laterality: N/A;   ELBOW SURGERY Right    ESOPHAGOGASTRODUODENOSCOPY ENDOSCOPY      Medications:  Current Outpatient Medications on File Prior to  Visit  Medication Sig   meloxicam (MOBIC) 15 MG tablet Take 15 mg by mouth daily.   No current facility-administered medications on file prior to visit.    Allergies:  Allergies  Allergen Reactions   Chantix [Varenicline] Other (See Comments)    Lip swelling    Social History:  Social History   Socioeconomic History   Marital status: Married    Spouse name: Not on file   Number of children: Not on file   Years of education: Not on file   Highest education level: Not on file  Occupational History   Not on file  Tobacco Use   Smoking status: Former    Packs/day: 1.00    Types: Cigarettes    Quit date: 01/16/2021    Years since quitting: 1.3   Smokeless tobacco: Former  Building services engineer Use: Never used  Substance and Sexual Activity   Alcohol use: No   Drug use: Not Currently    Types: Marijuana    Comment: Patient states he does a "Gummy"  occasionally   Sexual activity: Yes  Other Topics Concern   Not on file  Social History Narrative   Not on file   Social Determinants of Health   Financial Resource Strain: Not on file  Food Insecurity: Not on file  Transportation Needs: Not on file  Physical Activity: Not on file  Stress: Not on file  Social Connections: Not on file  Intimate Partner Violence: Not on file   Social History   Tobacco Use  Smoking Status Former   Packs/day: 1.00   Types: Cigarettes   Quit date: 01/16/2021   Years since quitting: 1.3  Smokeless Tobacco Former   Social History   Substance and Sexual Activity  Alcohol Use No    Family History:  Family History  Problem Relation Age of Onset   Brain cancer Mother    Liver disease Father    Heart disease Maternal Grandmother    Liver disease Paternal Grandmother    Other Paternal Grandfather        MVA    Past medical history, surgical history, medications, allergies, family history and social history reviewed with patient today and changes made to appropriate areas of the chart.   ROS All other ROS negative except what is listed above and in the HPI.      Objective:    BP 136/85   Pulse 61   Temp 97.7 F (36.5 C) (Oral)   Ht 6' (1.829 m)   Wt (!) 302 lb 6.4 oz (137.2 kg)   SpO2 97%   BMI 41.01 kg/m   Wt Readings from Last 3 Encounters:  05/12/22 (!) 302 lb 6.4 oz (137.2 kg)  03/29/22 (!) 302 lb 6.4 oz (137.2 kg)  09/23/21 286 lb 12.8 oz (130.1 kg)    Physical Exam  Results for orders placed or performed during the hospital encounter of 11/30/21  Lipase, blood  Result Value Ref Range   Lipase 43 11 - 51 U/L  Comprehensive metabolic panel  Result Value Ref Range   Sodium 137 135 - 145 mmol/L   Potassium 4.4 3.5 - 5.1 mmol/L   Chloride 103 98 - 111 mmol/L   CO2 27 22 - 32 mmol/L   Glucose, Bld 110 (H) 70 - 99 mg/dL   BUN 15 6 - 20 mg/dL   Creatinine, Ser 7.42 0.61 - 1.24 mg/dL   Calcium 8.8 (L) 8.9 - 10.3 mg/dL    Total  Protein 7.3 6.5 - 8.1 g/dL   Albumin 3.9 3.5 - 5.0 g/dL   AST 20 15 - 41 U/L   ALT 20 0 - 44 U/L   Alkaline Phosphatase 124 38 - 126 U/L   Total Bilirubin 0.7 0.3 - 1.2 mg/dL   GFR, Estimated >01 >77 mL/min   Anion gap 7 5 - 15  CBC  Result Value Ref Range   WBC 5.3 4.0 - 10.5 K/uL   RBC 5.26 4.22 - 5.81 MIL/uL   Hemoglobin 15.1 13.0 - 17.0 g/dL   HCT 93.9 03.0 - 09.2 %   MCV 85.6 80.0 - 100.0 fL   MCH 28.7 26.0 - 34.0 pg   MCHC 33.6 30.0 - 36.0 g/dL   RDW 33.0 07.6 - 22.6 %   Platelets 184 150 - 400 K/uL   nRBC 0.0 0.0 - 0.2 %  Urinalysis, Routine w reflex microscopic  Result Value Ref Range   Color, Urine YELLOW (A) YELLOW   APPearance CLEAR (A) CLEAR   Specific Gravity, Urine 1.023 1.005 - 1.030   pH 5.0 5.0 - 8.0   Glucose, UA NEGATIVE NEGATIVE mg/dL   Hgb urine dipstick MODERATE (A) NEGATIVE   Bilirubin Urine NEGATIVE NEGATIVE   Ketones, ur NEGATIVE NEGATIVE mg/dL   Protein, ur NEGATIVE NEGATIVE mg/dL   Nitrite NEGATIVE NEGATIVE   Leukocytes,Ua NEGATIVE NEGATIVE   RBC / HPF 11-20 0 - 5 RBC/hpf   WBC, UA 0-5 0 - 5 WBC/hpf   Bacteria, UA NONE SEEN NONE SEEN   Squamous Epithelial / LPF 0-5 0 - 5   Mucus PRESENT       Assessment & Plan:   Problem List Items Addressed This Visit       Respiratory   COPD (chronic obstructive pulmonary disease) (HCC)     Other   Class 3 severe obesity due to excess calories without serious comorbidity with body mass index (BMI) of 45.0 to 49.9 in adult [facility])    Recommend a healthy lifestyle through diet and exercise.       Depression, major, single episode, severe (HCC)    Chronic.  Controlled without medication.  Labs ordered today.  Return to clinic in 6 months for reevaluation.  Call sooner if concerns arise.        Hypercholesterolemia    Chronic.  Controlled.  Will increase Crestor to 10mg  daily.  Labs ordered today.  Return to clinic in 6 months for reevaluation.  Call sooner if concerns arise.        Relevant  Medications   rosuvastatin (CRESTOR) 10 MG tablet   Other Relevant Orders   Lipid panel   Other Visit Diagnoses     Annual physical exam    -  Primary   Health maintenance reviewed during visit today. Labs ordered. Shingrix given. Up to date on Colonoscopy.   Relevant Orders   TSH   PSA   Lipid panel   CBC with Differential/Platelet   Comprehensive metabolic panel   Urinalysis, Routine w reflex microscopic   Encounter for completion of form with patient       Patient needed form completed for employer.  Completed with physical.  A1c drawn for form completion.   Relevant Orders   HgB A1c   Need for shingles vaccine       Relevant Orders   Varicella-zoster vaccine subcutaneous         Discussed aspirin prophylaxis for myocardial infarction prevention and decision was it was not indicated  LABORATORY TESTING:  Health maintenance labs ordered today as discussed above.   The natural history of prostate cancer and ongoing controversy regarding screening and potential treatment outcomes of prostate cancer has been discussed with the patient. The meaning of a false positive PSA and a false negative PSA has been discussed. He indicates understanding of the limitations of this screening test and wishes to proceed with screening PSA testing.   IMMUNIZATIONS:   - Tdap: Tetanus vaccination status reviewed: last tetanus booster within 10 years. - Influenza: Up to date - Pneumovax: Up to date - Prevnar: Not applicable - COVID: Not applicable - HPV: Not applicable - Shingrix vaccine: Administered today  SCREENING: - Colonoscopy: Up to date  Discussed with patient purpose of the colonoscopy is to detect colon cancer at curable precancerous or early stages   - AAA Screening: Not applicable  -Hearing Test: Not applicable  -Spirometry: Not applicable   PATIENT COUNSELING:    Sexuality: Discussed sexually transmitted diseases, partner selection, use of condoms, avoidance of  unintended pregnancy  and contraceptive alternatives.   Advised to avoid cigarette smoking.  I discussed with the patient that most people either abstain from alcohol or drink within safe limits (<=14/week and <=4 drinks/occasion for males, <=7/weeks and <= 3 drinks/occasion for females) and that the risk for alcohol disorders and other health effects rises proportionally with the number of drinks per week and how often a drinker exceeds daily limits.  Discussed cessation/primary prevention of drug use and availability of treatment for abuse.   Diet: Encouraged to adjust caloric intake to maintain  or achieve ideal body weight, to reduce intake of dietary saturated fat and total fat, to limit sodium intake by avoiding high sodium foods and not adding table salt, and to maintain adequate dietary potassium and calcium preferably from fresh fruits, vegetables, and low-fat dairy products.    stressed the importance of regular exercise  Injury prevention: Discussed safety belts, safety helmets, smoke detector, smoking near bedding or upholstery.   Dental health: Discussed importance of regular tooth brushing, flossing, and dental visits.   Follow up plan: NEXT PREVENTATIVE PHYSICAL DUE IN 1 YEAR. Return in about 6 months (around 11/12/2022) for HTN, HLD, DM2 FU.

## 2022-05-12 NOTE — Assessment & Plan Note (Signed)
Recommend a healthy lifestyle through diet and exercise.  °

## 2022-05-12 NOTE — Assessment & Plan Note (Signed)
Chronic.  Controlled without medication..  Labs ordered today.  Return to clinic in 6 months for reevaluation.  Call sooner if concerns arise.  ° °

## 2022-05-12 NOTE — Assessment & Plan Note (Signed)
Chronic.  Controlled.  Will increase Crestor to 10mg daily.  Labs ordered today.  Return to clinic in 6 months for reevaluation.  Call sooner if concerns arise.   

## 2022-05-13 LAB — COMPREHENSIVE METABOLIC PANEL
ALT: 26 IU/L (ref 0–44)
AST: 24 IU/L (ref 0–40)
Albumin/Globulin Ratio: 1.5 (ref 1.2–2.2)
Albumin: 4.3 g/dL (ref 3.8–4.9)
Alkaline Phosphatase: 149 IU/L — ABNORMAL HIGH (ref 44–121)
BUN/Creatinine Ratio: 10 (ref 9–20)
BUN: 10 mg/dL (ref 6–24)
Bilirubin Total: 0.5 mg/dL (ref 0.0–1.2)
CO2: 23 mmol/L (ref 20–29)
Calcium: 9.4 mg/dL (ref 8.7–10.2)
Chloride: 103 mmol/L (ref 96–106)
Creatinine, Ser: 1.04 mg/dL (ref 0.76–1.27)
Globulin, Total: 2.9 g/dL (ref 1.5–4.5)
Glucose: 87 mg/dL (ref 70–99)
Potassium: 4.4 mmol/L (ref 3.5–5.2)
Sodium: 142 mmol/L (ref 134–144)
Total Protein: 7.2 g/dL (ref 6.0–8.5)
eGFR: 85 mL/min/{1.73_m2} (ref >59)

## 2022-05-13 LAB — CBC WITH DIFFERENTIAL/PLATELET
Basophils Absolute: 0.1 10*3/uL (ref 0.0–0.2)
Basos: 1 %
EOS (ABSOLUTE): 0.1 10*3/uL (ref 0.0–0.4)
Eos: 2 %
Hematocrit: 45.8 % (ref 37.5–51.0)
Hemoglobin: 15.8 g/dL (ref 13.0–17.7)
Immature Grans (Abs): 0 10*3/uL (ref 0.0–0.1)
Immature Granulocytes: 0 %
Lymphocytes Absolute: 1.8 10*3/uL (ref 0.7–3.1)
Lymphs: 32 %
MCH: 29.3 pg (ref 26.6–33.0)
MCHC: 34.5 g/dL (ref 31.5–35.7)
MCV: 85 fL (ref 79–97)
Monocytes Absolute: 0.3 10*3/uL (ref 0.1–0.9)
Monocytes: 5 %
Neutrophils Absolute: 3.5 10*3/uL (ref 1.4–7.0)
Neutrophils: 60 %
Platelets: 186 10*3/uL (ref 150–450)
RBC: 5.39 x10E6/uL (ref 4.14–5.80)
RDW: 12.8 % (ref 11.6–15.4)
WBC: 5.8 10*3/uL (ref 3.4–10.8)

## 2022-05-13 LAB — LIPID PANEL
Chol/HDL Ratio: 3.7 ratio (ref 0.0–5.0)
Cholesterol, Total: 125 mg/dL (ref 100–199)
HDL: 34 mg/dL — ABNORMAL LOW (ref >39)
LDL Chol Calc (NIH): 65 mg/dL (ref 0–99)
Triglycerides: 146 mg/dL (ref 0–149)
VLDL Cholesterol Cal: 26 mg/dL (ref 5–40)

## 2022-05-13 LAB — HEMOGLOBIN A1C
Est. average glucose Bld gHb Est-mCnc: 114 mg/dL
Hgb A1c MFr Bld: 5.6 % (ref 4.8–5.6)

## 2022-05-13 LAB — PSA: Prostate Specific Ag, Serum: 1 ng/mL (ref 0.0–4.0)

## 2022-05-13 LAB — TSH: TSH: 2.28 u[IU]/mL (ref 0.450–4.500)

## 2022-05-13 NOTE — Progress Notes (Signed)
Please let patient know that his lab work looks good.  No evidence of diabetes.  His cholesterol has improved from prior.  Continue with current medication regimen as discussed during the visit.  No concerns at this time.  Follow up as discussed.

## 2022-07-01 ENCOUNTER — Encounter: Payer: Self-pay | Admitting: Nurse Practitioner

## 2022-10-13 ENCOUNTER — Other Ambulatory Visit: Payer: Self-pay | Admitting: Nurse Practitioner

## 2022-10-13 NOTE — Telephone Encounter (Signed)
Pt called and wants to this sent to the [facility] in [id number], [place] - [address] AT [facility] OF [address]  [address] [place] [address]  Phone: [phone] Fax: [phone]

## 2022-10-13 NOTE — Telephone Encounter (Signed)
Requested Prescriptions  Pending Prescriptions Disp Refills   omeprazole (PRILOSEC) 20 MG capsule [Pharmacy Med Name: OMEPRAZOLE DR 20 MG CAPSULE] 90 capsule 0    Sig: TAKE 1 CAPSULE BY MOUTH EVERY DAY     Gastroenterology: Proton Pump Inhibitors Passed - 10/13/2022 12:46 PM      Passed - Valid encounter within last 12 months    Recent Outpatient Visits           5 months ago Annual physical exam   [facility] Larae Grooms, NP   6 months ago Depression, major, single episode, severe (HCC)   [facility] Larae Grooms, NP   1 year ago Depression, major, single episode, severe (HCC)   [facility] Larae Grooms, NP   1 year ago Depression, major, single episode, severe (HCC)   [facility] Larae Grooms, NP   1 year ago Chronic obstructive pulmonary disease, unspecified COPD type (HCC)   [facility] Larae Grooms, NP       Future Appointments             In 3 weeks Larae Grooms, NP [facility]

## 2022-10-14 NOTE — Telephone Encounter (Signed)
Requested medication (s) are due for refill today: alternative pharmacy requested  Requested medication (s) are on the active medication list: yes  Last refill:  10/13/22  Future visit scheduled: yes  Notes to clinic:  Alternative Requested:MUST DO MAILORDER OR USE [facility].      Requested Prescriptions  Pending Prescriptions Disp Refills   omeprazole (PRILOSEC) 20 MG capsule [Pharmacy Med Name: OMEPRAZOLE DR 20 MG CAPSULE] 90 capsule 0    Sig: TAKE 1 CAPSULE BY MOUTH EVERY DAY     Gastroenterology: Proton Pump Inhibitors Passed - 10/13/2022  4:37 PM      Passed - Valid encounter within last 12 months    Recent Outpatient Visits           5 months ago Annual physical exam   [facility] Larae Grooms, NP   6 months ago Depression, major, single episode, severe (HCC)   [facility] Larae Grooms, NP   1 year ago Depression, major, single episode, severe (HCC)   [facility] Larae Grooms, NP   1 year ago Depression, major, single episode, severe (HCC)   [facility] Larae Grooms, NP   1 year ago Chronic obstructive pulmonary disease, unspecified COPD type (HCC)   [facility] Larae Grooms, NP       Future Appointments             In 3 weeks Larae Grooms, NP [facility], [facility]

## 2022-10-15 MED ORDER — OMEPRAZOLE 20 MG PO CPDR: 20.0000 mg | DELAYED_RELEASE_CAPSULE | Freq: Every day | ORAL | 1 refills | Status: DC

## 2022-10-15 NOTE — Telephone Encounter (Signed)
Called and spoke with patient. States he needs his medication sent to [facility] in [place].

## 2022-10-15 NOTE — Telephone Encounter (Signed)
Medication sent to [facility] in [place].

## 2022-11-09 ENCOUNTER — Encounter: Payer: Self-pay | Admitting: Nurse Practitioner

## 2022-11-09 ENCOUNTER — Ambulatory Visit (INDEPENDENT_AMBULATORY_CARE_PROVIDER_SITE_OTHER): Payer: Managed Care, Other (non HMO) | Admitting: Nurse Practitioner

## 2022-11-09 VITALS — BP 126/84 | HR 73 | Temp 97.5°F | Ht 72.0 in | Wt 315.4 lb

## 2022-11-09 DIAGNOSIS — J449 Chronic obstructive pulmonary disease, unspecified: Secondary | ICD-10-CM

## 2022-11-09 DIAGNOSIS — Z23 Encounter for immunization: Secondary | ICD-10-CM | POA: Diagnosis not present

## 2022-11-09 DIAGNOSIS — M545 Low back pain, unspecified: Secondary | ICD-10-CM

## 2022-11-09 DIAGNOSIS — F322 Major depressive disorder, single episode, severe without psychotic features: Secondary | ICD-10-CM | POA: Diagnosis not present

## 2022-11-09 DIAGNOSIS — Z6841 Body Mass Index (BMI) 40.0 and over, adult: Secondary | ICD-10-CM

## 2022-11-09 DIAGNOSIS — E7800 Pure hypercholesterolemia, unspecified: Secondary | ICD-10-CM

## 2022-11-09 DIAGNOSIS — G8929 Other chronic pain: Secondary | ICD-10-CM

## 2022-11-09 DIAGNOSIS — R7309 Other abnormal glucose: Secondary | ICD-10-CM

## 2022-11-09 MED ORDER — INDOMETHACIN 50 MG PO CAPS: 50.0000 mg | ORAL_CAPSULE | Freq: Two times a day (BID) | ORAL | 2 refills | Status: DC | PRN

## 2022-11-09 MED ORDER — OMEPRAZOLE 20 MG PO CPDR: 20.0000 mg | DELAYED_RELEASE_CAPSULE | Freq: Every day | ORAL | 1 refills | Status: DC

## 2022-11-09 MED ORDER — ROSUVASTATIN CALCIUM 10 MG PO TABS: 10.0000 mg | ORAL_TABLET | Freq: Every day | ORAL | 1 refills | Status: DC

## 2022-11-09 NOTE — Assessment & Plan Note (Signed)
Chronic. Not well controlled.  Feels like it is due to his back pain.  Feels like if he could get help with his back pain his mood would improve.  Referral placed for pain management for patient.

## 2022-11-09 NOTE — Assessment & Plan Note (Signed)
Chronic.  Controlled.  Continue with current medication regimen.  Labs ordered today.  Return to clinic in 6 months for reevaluation.  Call sooner if concerns arise.  ? ?

## 2022-11-09 NOTE — Progress Notes (Signed)
BP 126/84   Pulse 73   Temp (!) 97.5 F (36.4 C) (Oral)   Ht 6' (1.829 m)   Wt (!) 315 lb 6.4 oz (143.1 kg)   SpO2 97%   BMI 42.78 kg/m    Subjective:    Patient ID: Walter Gaster., male    DOB: 02-19-1966, 57 y.o.   MRN: [id number]  HPI: Walter Giampietro. is a 57 y.o. male  Chief Complaint  Patient presents with   Hyperlipidemia        DEPRESSION Patient states a lot of his mood revolves around him being in back pain.  He doesn't get out a lot due to the weather and not really feeling like he wants to do anything.  He wants to get help for his back pain and hoping that will help.    COPD COPD status: controlled Satisfied with current treatment?: yes Oxygen use: no Dyspnea frequency: some Cough frequency:  Rescue inhaler frequency:  hasn't used it in over a month.  Limitation of activity: yes Productive cough:  Last Spirometry:  Pneumovax: Up to Date Influenza: Up to Date  HYPERLIPIDEMIA Hyperlipidemia status:  not currently on medication  Satisfied with current treatment?  yes Side effects:  no Medication compliance: excellent compliance Past cholesterol meds: none Supplements: none Aspirin:  no The ASCVD Risk score (Arnett DK, et al., 2019) failed to calculate for the following reasons:   The valid total cholesterol range is 130 to 320 mg/dL Chest pain:  no Coronary artery disease:  no Family history CAD:  no Family history early CAD:  no  Has ongoing back pain since his injury at work in 2017.  States it makes his hips hurt.  He tries putting his feet up.  He wakes up hurting and goes to bed hurting.     Relevant past medical, surgical, family and social history reviewed and updated as indicated. Interim medical history since our last visit reviewed. Allergies and medications reviewed and updated.  Review of Systems  Eyes:  Negative for visual disturbance.  Respiratory:  Negative for cough and shortness of breath.   Cardiovascular:   Negative for chest pain and leg swelling.  Neurological:  Negative for light-headedness and headaches.  Psychiatric/Behavioral:  Positive for dysphoric mood. Negative for suicidal ideas. The patient is not nervous/anxious.     Per HPI unless specifically indicated above     Objective:    BP 126/84   Pulse 73   Temp (!) 97.5 F (36.4 C) (Oral)   Ht 6' (1.829 m)   Wt (!) 315 lb 6.4 oz (143.1 kg)   SpO2 97%   BMI 42.78 kg/m   Wt Readings from Last 3 Encounters:  11/09/22 (!) 315 lb 6.4 oz (143.1 kg)  05/12/22 (!) 302 lb 6.4 oz (137.2 kg)  03/29/22 (!) 302 lb 6.4 oz (137.2 kg)    Physical Exam Vitals and nursing note reviewed.  Constitutional:      General: He is not in acute distress.    Appearance: Normal appearance. He is obese. He is not ill-appearing, toxic-appearing or diaphoretic.  HENT:     Head: Normocephalic.     Right Ear: External ear normal.     Left Ear: External ear normal.     Nose: Nose normal. No congestion or rhinorrhea.     Mouth/Throat:     Mouth: Mucous membranes are moist.  Eyes:     General:        Right  eye: No discharge.        Left eye: No discharge.     Extraocular Movements: Extraocular movements intact.     Conjunctiva/sclera: Conjunctivae normal.     Pupils: Pupils are equal, round, and reactive to light.  Cardiovascular:     Rate and Rhythm: Normal rate and regular rhythm.     Heart sounds: No murmur heard. Pulmonary:     Effort: Pulmonary effort is normal. No respiratory distress.     Breath sounds: Normal breath sounds. No wheezing, rhonchi or rales.  Abdominal:     General: Abdomen is flat. Bowel sounds are normal.  Musculoskeletal:     Cervical back: Normal range of motion and neck supple.  Skin:    General: Skin is warm and dry.     Capillary Refill: Capillary refill takes less than 2 seconds.  Neurological:     General: No focal deficit present.     Mental Status: He is alert and oriented to person, place, and time.   Psychiatric:        Mood and Affect: Mood normal.        Behavior: Behavior normal.        Thought Content: Thought content normal.        Judgment: Judgment normal.     Results for orders placed or performed in visit on 05/12/22  Microscopic Examination   Urine  Result Value Ref Range   WBC, UA None seen 0 - 5 /hpf   RBC, Urine 0-2 0 - 2 /hpf   Epithelial Cells (non renal) 0-10 0 - 10 /hpf   Mucus, UA Present (A) Not Estab.   Bacteria, UA None seen None seen/Few  TSH  Result Value Ref Range   TSH 2.280 0.450 - 4.500 uIU/mL  PSA  Result Value Ref Range   Prostate Specific Ag, Serum 1.0 0.0 - 4.0 ng/mL  Lipid panel  Result Value Ref Range   Cholesterol, Total 125 100 - 199 mg/dL   Triglycerides 086 0 - 149 mg/dL   HDL 34 (L) >57 mg/dL   VLDL Cholesterol Cal 26 5 - 40 mg/dL   LDL Chol Calc (NIH) 65 0 - 99 mg/dL   Chol/HDL Ratio 3.7 0.0 - 5.0 ratio  CBC with Differential/Platelet  Result Value Ref Range   WBC 5.8 3.4 - 10.8 x10E3/uL   RBC 5.39 4.14 - 5.80 x10E6/uL   Hemoglobin 15.8 13.0 - 17.7 g/dL   Hematocrit 84.6 96.2 - 51.0 %   MCV 85 79 - 97 fL   MCH 29.3 26.6 - 33.0 pg   MCHC 34.5 31.5 - 35.7 g/dL   RDW 95.2 84.1 - 32.4 %   Platelets 186 150 - 450 x10E3/uL   Neutrophils 60 Not Estab. %   Lymphs 32 Not Estab. %   Monocytes 5 Not Estab. %   Eos 2 Not Estab. %   Basos 1 Not Estab. %   Neutrophils Absolute 3.5 1.4 - 7.0 x10E3/uL   Lymphocytes Absolute 1.8 0.7 - 3.1 x10E3/uL   Monocytes Absolute 0.3 0.1 - 0.9 x10E3/uL   EOS (ABSOLUTE) 0.1 0.0 - 0.4 x10E3/uL   Basophils Absolute 0.1 0.0 - 0.2 x10E3/uL   Immature Granulocytes 0 Not Estab. %   Immature Grans (Abs) 0.0 0.0 - 0.1 x10E3/uL  Comprehensive metabolic panel  Result Value Ref Range   Glucose 87 70 - 99 mg/dL   BUN 10 6 - 24 mg/dL   Creatinine, Ser 4.01 0.76 - 1.27 mg/dL  eGFR 85 >59 mL/min/1.73   BUN/Creatinine Ratio 10 9 - 20   Sodium 142 134 - 144 mmol/L   Potassium 4.4 3.5 - 5.2 mmol/L    Chloride 103 96 - 106 mmol/L   CO2 23 20 - 29 mmol/L   Calcium 9.4 8.7 - 10.2 mg/dL   Total Protein 7.2 6.0 - 8.5 g/dL   Albumin 4.3 3.8 - 4.9 g/dL   Globulin, Total 2.9 1.5 - 4.5 g/dL   Albumin/Globulin Ratio 1.5 1.2 - 2.2   Bilirubin Total 0.5 0.0 - 1.2 mg/dL   Alkaline Phosphatase 149 (H) 44 - 121 IU/L   AST 24 0 - 40 IU/L   ALT 26 0 - 44 IU/L  Urinalysis, Routine w reflex microscopic  Result Value Ref Range   Specific Gravity, UA >1.030 (H) 1.005 - 1.030   pH, UA 5.5 5.0 - 7.5   Color, UA Yellow Yellow   Appearance Ur Clear Clear   Leukocytes,UA Negative Negative   Protein,UA Negative Negative/Trace   Glucose, UA Negative Negative   Ketones, UA Negative Negative   RBC, UA Trace (A) Negative   Bilirubin, UA Negative Negative   Urobilinogen, Ur 0.2 0.2 - 1.0 mg/dL   Nitrite, UA Negative Negative   Microscopic Examination See below:   HgB A1c  Result Value Ref Range   Hgb A1c MFr Bld 5.6 4.8 - 5.6 %   Est. average glucose Bld gHb Est-mCnc 114 mg/dL      Assessment & Plan:   Problem List Items Addressed This Visit       Respiratory   COPD (chronic obstructive pulmonary disease) (HCC) - Primary    Chronic.  Controlled.  Continue with current medication regimen.  Labs ordered today.  Return to clinic in 6 months for reevaluation.  Call sooner if concerns arise.          Other   Class 3 severe obesity due to excess calories without serious comorbidity with body mass index (BMI) of 45.0 to 49.9 in adult [facility])    Recommended eating smaller high protein, low fat meals more frequently and exercising 30 mins a day 5 times a week with a goal of 10-15lb weight loss in the next 3 months.       Relevant Orders   Comp Met (CMET)   Chronic low back pain    Chronic. Since 2017 when he has an injury.  Would like to see pain management.       Relevant Medications   indomethacin (INDOCIN) 50 MG capsule   Other Relevant Orders   Ambulatory referral to Pain Clinic   Depression,  major, single episode, severe (HCC)    Chronic. Not well controlled.  Feels like it is due to his back pain.  Feels like if he could get help with his back pain his mood would improve.  Referral placed for pain management for patient.       Hypercholesterolemia    Chronic.  Controlled.  Continue with current medication regimen.  Labs ordered today.  Return to clinic in 6 months for reevaluation.  Call sooner if concerns arise.        Relevant Medications   rosuvastatin (CRESTOR) 10 MG tablet   Other Relevant Orders   Lipid Profile   Other Visit Diagnoses     Elevated glucose       Relevant Orders   HgB A1c   Need for shingles vaccine       Relevant Orders   Zoster  Recombinant (Shingrix ) (Completed)        Follow up plan: Return in about 6 months (around 05/10/2023) for Physical and Fasting labs.

## 2022-11-09 NOTE — Assessment & Plan Note (Signed)
Chronic. Since 2017 when he has an injury.  Would like to see pain management.

## 2022-11-09 NOTE — Assessment & Plan Note (Signed)
Recommended eating smaller high protein, low fat meals more frequently and exercising 30 mins a day 5 times a week with a goal of 10-15lb weight loss in the next 3 months.  

## 2022-11-10 LAB — COMPREHENSIVE METABOLIC PANEL
ALT: 34 IU/L (ref 0–44)
AST: 29 IU/L (ref 0–40)
Albumin/Globulin Ratio: 1.8 (ref 1.2–2.2)
Albumin: 4.4 g/dL (ref 3.8–4.9)
Alkaline Phosphatase: 147 IU/L — ABNORMAL HIGH (ref 44–121)
BUN/Creatinine Ratio: 12 (ref 9–20)
BUN: 11 mg/dL (ref 6–24)
Bilirubin Total: 0.3 mg/dL (ref 0.0–1.2)
CO2: 23 mmol/L (ref 20–29)
Calcium: 9 mg/dL (ref 8.7–10.2)
Chloride: 102 mmol/L (ref 96–106)
Creatinine, Ser: 0.94 mg/dL (ref 0.76–1.27)
Globulin, Total: 2.5 g/dL (ref 1.5–4.5)
Glucose: 92 mg/dL (ref 70–99)
Potassium: 4.5 mmol/L (ref 3.5–5.2)
Sodium: 139 mmol/L (ref 134–144)
Total Protein: 6.9 g/dL (ref 6.0–8.5)
eGFR: 96 mL/min/{1.73_m2} (ref >59)

## 2022-11-10 LAB — LIPID PANEL
Chol/HDL Ratio: 4.9 ratio (ref 0.0–5.0)
Cholesterol, Total: 136 mg/dL (ref 100–199)
HDL: 28 mg/dL — ABNORMAL LOW (ref >39)
LDL Chol Calc (NIH): 65 mg/dL (ref 0–99)
Triglycerides: 268 mg/dL — ABNORMAL HIGH (ref 0–149)
VLDL Cholesterol Cal: 43 mg/dL — ABNORMAL HIGH (ref 5–40)

## 2022-11-10 LAB — HEMOGLOBIN A1C
Est. average glucose Bld gHb Est-mCnc: 123 mg/dL
Hgb A1c MFr Bld: 5.9 % — ABNORMAL HIGH (ref 4.8–5.6)

## 2022-11-10 NOTE — Progress Notes (Signed)
Please let patient know that his lab work looks good.  Liver, kidneys, and electrolytes look good.  A1c which is a measure of diabetes does show that he is prediabetic.  No concerns at this time.  Follow up as discussed.

## 2022-11-12 ENCOUNTER — Ambulatory Visit: Payer: Managed Care, Other (non HMO) | Admitting: Nurse Practitioner

## 2023-01-18 ENCOUNTER — Encounter: Payer: Self-pay | Admitting: Nurse Practitioner

## 2023-05-07 ENCOUNTER — Other Ambulatory Visit: Payer: Self-pay | Admitting: Nurse Practitioner

## 2023-05-09 NOTE — Telephone Encounter (Signed)
Requested Prescriptions  Pending Prescriptions Disp Refills   indomethacin (INDOCIN) 50 MG capsule [Pharmacy Med Name: INDOMETHACIN 50MG  CAPSULES] 180 capsule 0    Sig: TAKE 1 CAPSULE(50 MG) BY MOUTH TWICE DAILY AS NEEDED FOR GOUT FLARES     Analgesics:  NSAIDS Failed - 05/07/2023  6:20 AM      Failed - Manual Review: Labs are only required if the patient has taken medication for more than 8 weeks.      Passed - Cr in normal range and within 360 days    Creatinine  Date Value Ref Range Status  04/16/2013 1.18 0.60 - 1.30 mg/dL Final   Creatinine, Ser  Date Value Ref Range Status  11/09/2022 0.94 0.76 - 1.27 mg/dL Final         Passed - HGB in normal range and within 360 days    Hemoglobin  Date Value Ref Range Status  05/12/2022 15.8 13.0 - 17.7 g/dL Final         Passed - PLT in normal range and within 360 days    Platelets  Date Value Ref Range Status  05/12/2022 186 150 - 450 x10E3/uL Final         Passed - HCT in normal range and within 360 days    Hematocrit  Date Value Ref Range Status  05/12/2022 45.8 37.5 - 51.0 % Final         Passed - eGFR is 30 or above and within 360 days    EGFR (African American)  Date Value Ref Range Status  04/16/2013 >60  Final   GFR calc Af Amer  Date Value Ref Range Status  05/08/2020 93 >59 mL/min/1.73 Final    Comment:    **[facility] currently reports eGFR in compliance with the current**   recommendations of the [employer]. [facility] will   update reporting as new guidelines are published from the NKF-ASN   Task force.    EGFR (Non-African Amer.)  Date Value Ref Range Status  04/16/2013 >60  Final    Comment:    eGFR values <61mL/min/1.73 m2 may be an indication of chronic kidney disease (CKD). Calculated eGFR is useful in patients with stable renal function. The eGFR calculation will not be reliable in acutely ill patients when serum creatinine is changing rapidly. It is not useful in  patients on  dialysis. The eGFR calculation may not be applicable to patients at the low and high extremes of body sizes, pregnant women, and vegetarians.    GFR, Estimated  Date Value Ref Range Status  11/30/2021 >60 >60 mL/min Final    Comment:    (NOTE) Calculated using the CKD-EPI Creatinine Equation (2021)    eGFR  Date Value Ref Range Status  11/09/2022 96 >59 mL/min/1.73 Final         Passed - Patient is not pregnant      Passed - Valid encounter within last 12 months    Recent Outpatient Visits           6 months ago Chronic obstructive pulmonary disease, unspecified COPD type (HCC)   [place] [facility] Larae Grooms, NP   12 months ago Annual physical exam   [facility] Larae Grooms, NP   1 year ago Depression, major, single episode, severe [facility])   [place] [facility] Larae Grooms, NP   1 year ago Depression, major, single episode, severe [facility])   [place] [facility] Larae Grooms, NP  1 year ago Depression, major, single episode, severe [facility])   [place] [facility] Larae Grooms, NP       Future Appointments             In 1 week Mecum, Oswaldo Conroy, PA-C [facility], [facility]

## 2023-05-10 ENCOUNTER — Encounter: Payer: Managed Care, Other (non HMO) | Admitting: Physician Assistant

## 2023-05-16 ENCOUNTER — Ambulatory Visit (INDEPENDENT_AMBULATORY_CARE_PROVIDER_SITE_OTHER): Payer: Managed Care, Other (non HMO) | Admitting: Physician Assistant

## 2023-05-16 ENCOUNTER — Encounter: Payer: Self-pay | Admitting: Physician Assistant

## 2023-05-16 VITALS — BP 148/93 | HR 57 | Ht 71.0 in | Wt 315.8 lb

## 2023-05-16 DIAGNOSIS — Z0000 Encounter for general adult medical examination without abnormal findings: Secondary | ICD-10-CM

## 2023-05-16 DIAGNOSIS — Z122 Encounter for screening for malignant neoplasm of respiratory organs: Secondary | ICD-10-CM

## 2023-05-16 DIAGNOSIS — R0602 Shortness of breath: Secondary | ICD-10-CM | POA: Diagnosis not present

## 2023-05-16 DIAGNOSIS — J449 Chronic obstructive pulmonary disease, unspecified: Secondary | ICD-10-CM | POA: Diagnosis not present

## 2023-05-16 DIAGNOSIS — E7800 Pure hypercholesterolemia, unspecified: Secondary | ICD-10-CM

## 2023-05-16 LAB — URINALYSIS, ROUTINE W REFLEX MICROSCOPIC
Bilirubin, UA: NEGATIVE
Glucose, UA: NEGATIVE
Ketones, UA: NEGATIVE
Leukocytes,UA: NEGATIVE
Nitrite, UA: NEGATIVE
Protein,UA: NEGATIVE
RBC, UA: NEGATIVE
Specific Gravity, UA: 1.02 (ref 1.005–1.030)
Urobilinogen, Ur: 1 mg/dL (ref 0.2–1.0)
pH, UA: 7 (ref 5.0–7.5)

## 2023-05-16 MED ORDER — TRELEGY ELLIPTA 100-62.5-25 MCG/ACT IN AEPB
1.0000 | INHALATION_SPRAY | Freq: Every day | RESPIRATORY_TRACT | 11 refills | Status: AC
Start: 2023-05-16 — End: ?

## 2023-05-16 NOTE — Assessment & Plan Note (Signed)
Chronic, potentially exacerbated He reports increased SOBOE and sweating more especially when active during hot weather EKG was normal today- results reviewed with him during apt Will add Trelegy to regimen to assist with COPD management Reviewed Albuterol use If symptoms of SOB persist may need to refer to Cardiology and get Zio monitor for further work up Follow up in 3 months or sooner if concerns arise

## 2023-05-16 NOTE — Progress Notes (Signed)
Annual Physical Exam   Name: Walter Mcclain.   MRN: [id number]    DOB: [birth year]   Date:05/16/2023  Today's Provider: Jacquelin Hawking, MHS, PA-C Introduced myself to the patient as a PA-C and provided education on APPs in clinical practice.         Subjective  Chief Complaint  Chief Complaint  Patient presents with   Annual Exam   Excessive Sweating   Shortness of Breath    HPI  Patient presents for annual CPE .   Diet: he is not engaged in a diet plan to help with weight loss  Exercise: he is not able to exercise regularly due to back pain. He would like to go to the gym but this is limited by back pain and spasms  Sleep: "I sleep great"  Mood:He reports some depression with pain and weight gain. States he has had decreased motivation and activity.     Shortness of Breath  He reports he is concerned that he has A.fib He states he has SOB, sweating, reduced energy and tolerance He denies palpitations but reports occasional cramping sensations in left axilla, left chest and left arm  He states he becomes SOB when it's hot outside and high humidity   He sometimes uses an inhaler - has had to increase his use over the last two months   He was going to pain management in [place] but states he had to stop going due to copay cost and medications not being covered by insurance      Depression: phq 9 is positive    05/16/2023   11:10 AM 11/09/2022    2:08 PM 05/12/2022    1:43 PM 03/29/2022   10:52 AM 09/23/2021    1:47 PM  Depression screen PHQ 2/9  Decreased Interest 2 2 2 1 2   Down, Depressed, Hopeless 1 1 1 1 2   PHQ - 2 Score 3 3 3 2 4   Altered sleeping 1 0 1 0 2  Tired, decreased energy 0 2 1 0 2  Change in appetite 2 0 0 0 1  Feeling bad or failure about yourself  2 2 1  0 2  Trouble concentrating 0 0 1 0 1  Moving slowly or fidgety/restless 0 0 1 0 1  Suicidal thoughts 0 0 1 0 0  PHQ-9 Score 8 7 9 2 13   Difficult doing work/chores  Somewhat  difficult Not difficult at all Not difficult at all     Hypertension:  BP Readings from Last 3 Encounters:  05/16/23 (!) 148/93  11/09/22 126/84  05/12/22 136/85    Obesity: Wt Readings from Last 3 Encounters:  05/16/23 (!) 315 lb 12.8 oz (143.2 kg)  11/09/22 (!) 315 lb 6.4 oz (143.1 kg)  05/12/22 (!) 302 lb 6.4 oz (137.2 kg)   BMI Readings from Last 3 Encounters:  05/16/23 44.05 kg/m  11/09/22 42.78 kg/m  05/12/22 41.01 kg/m     Lipids:  Lab Results  Component Value Date   CHOL 136 11/09/2022   CHOL 125 05/12/2022   CHOL 173 09/23/2021   Lab Results  Component Value Date   HDL 28 (L) 11/09/2022   HDL 34 (L) 05/12/2022   HDL 32 (L) 09/23/2021   Lab Results  Component Value Date   LDLCALC 65 11/09/2022   LDLCALC 65 05/12/2022   LDLCALC 113 (H) 09/23/2021   Lab Results  Component Value Date   TRIG 268 (H) 11/09/2022  TRIG 146 05/12/2022   TRIG 157 (H) 09/23/2021   Lab Results  Component Value Date   CHOLHDL 4.9 11/09/2022   CHOLHDL 3.7 05/12/2022   CHOLHDL 5.4 (H) 09/23/2021   No results found for: "LDLDIRECT" Glucose:  Glucose  Date Value Ref Range Status  11/09/2022 92 70 - 99 mg/dL Final  82/95/6213 87 70 - 99 mg/dL Final  08/65/7846 83 70 - 99 mg/dL Final  96/29/5284 132 (H) 65 - 99 mg/dL Final  44/10/270 92 65 - 99 mg/dL Final   Glucose, Bld  Date Value Ref Range Status  11/30/2021 110 (H) 70 - 99 mg/dL Final    Comment:    Glucose reference range applies only to samples taken after fasting for at least 8 hours.       Married STD testing and prevention (HIV/chl/gon/syphilis):  no HIV screening: completed  Hep C Screening: completed  Skin cancer: Discussed monitoring for atypical lesions Colorectal cancer screening: UTD Prostate cancer screening:  yes- ordered today  No results found for: "PSA"   Lung cancer:  Low Dose CT Chest recommended if Age 9-80 years, 30 pack-year currently smoking OR have quit w/in 15years. Patient   yes AAA: The USPSTF recommends one-time screening with ultrasonography in men ages 75 to 75 years who have ever smoked. Patient:  not applicable ECG:  Completed in office today  EKG performed today in office EKG demonstrates sinus bradycardia, rate of ~59 bpm Today's EKG was compared to previous EKG from 11/30/21 and there are no significant changes    Vaccines:   HPV: aged out  Tdap: utd Shingrix: Completed  Pneumonia: NA Flu: postponed to fall  COVID-19: Discussed vaccine and booster recommendations per available CDC guidelines    Advanced Care Planning: A voluntary discussion about advance care planning including the explanation and discussion of advance directives.  Discussed health care proxy and Living will, and the patient was able to identify a health care proxy as no one.  Patient does not have a living will in effect at this time.  Patient Active Problem List   Diagnosis Date Noted   Hypercholesterolemia 09/23/2021   Depression, major, single episode, severe (HCC) 09/25/2020   COPD (chronic obstructive pulmonary disease) (HCC) 05/11/2020   Gout 04/01/2020   Nicotine dependence, cigarettes, uncomplicated 04/01/2020   Chronic low back pain 04/01/2020   Class 3 severe obesity due to excess calories without serious comorbidity with body mass index (BMI) of 45.0 to 49.9 in adult [facility])    Chronic gouty arthritis 11/19/2015    Past Surgical History:  Procedure Laterality Date   COLONOSCOPY WITH PROPOFOL N/A 06/30/2020   Procedure: COLONOSCOPY WITH PROPOFOL;  Surgeon: Toney Reil, MD;  Location: [facility] ENDOSCOPY;  Service: Gastroenterology;  Laterality: N/A;   ELBOW SURGERY Right    ESOPHAGOGASTRODUODENOSCOPY ENDOSCOPY      Family History  Problem Relation Age of Onset   Brain cancer Mother    Liver disease Father    Heart disease Maternal Grandmother    Liver disease Paternal Grandmother    Other Paternal Grandfather        MVA    Social History    Socioeconomic History   Marital status: Married    Spouse name: Not on file   Number of children: Not on file   Years of education: Not on file   Highest education level: Not on file  Occupational History   Not on file  Tobacco Use   Smoking status: Former  Current packs/day: 0.00    Types: Cigarettes    Quit date: 01/16/2021    Years since quitting: 2.3   Smokeless tobacco: Former  Building services engineer status: Never Used  Substance and Sexual Activity   Alcohol use: No   Drug use: Not Currently    Types: Marijuana    Comment: Patient states he does a "Gummy" occasionally   Sexual activity: Yes  Other Topics Concern   Not on file  Social History Narrative   Not on file   Social Determinants of Health   Financial Resource Strain: Not on file  Food Insecurity: Not on file  Transportation Needs: Not on file  Physical Activity: Not on file  Stress: Not on file  Social Connections: Not on file  Intimate Partner Violence: Not on file     Current Outpatient Medications:    Fluticasone-Umeclidin-Vilant (TRELEGY ELLIPTA) 100-62.5-25 MCG/ACT AEPB, Inhale 1 puff into the lungs daily., Disp: 1 each, Rfl: 11   indomethacin (INDOCIN) 50 MG capsule, TAKE 1 CAPSULE(50 MG) BY MOUTH TWICE DAILY AS NEEDED FOR GOUT FLARES, Disp: 180 capsule, Rfl: 0   omeprazole (PRILOSEC) 20 MG capsule, Take 1 capsule (20 mg total) by mouth daily., Disp: 90 capsule, Rfl: 1   rosuvastatin (CRESTOR) 10 MG tablet, Take 1 tablet (10 mg total) by mouth daily., Disp: 90 tablet, Rfl: 1  Allergies  Allergen Reactions   Chantix [Varenicline] Other (See Comments)    Lip swelling     Review of Systems  Constitutional:  Positive for malaise/fatigue. Negative for chills, fever and weight loss.  Eyes:  Negative for blurred vision, double vision and photophobia.  Respiratory:  Positive for shortness of breath. Negative for wheezing.   Cardiovascular:  Negative for chest pain, palpitations and leg swelling.   Gastrointestinal:  Negative for constipation, diarrhea, nausea and vomiting.  Genitourinary:  Negative for dysuria.  Musculoskeletal:  Positive for back pain. Negative for falls.  Skin:  Negative for itching and rash.  Neurological:  Negative for dizziness, tingling, tremors, loss of consciousness and headaches.  Psychiatric/Behavioral:  Positive for depression. The patient is not nervous/anxious.        Objective  Vitals:   05/16/23 1057  BP: (!) 148/93  Pulse: (!) 57  SpO2: 99%  Weight: (!) 315 lb 12.8 oz (143.2 kg)  Height: 5\' 11"  (1.803 m)    Body mass index is 44.05 kg/m.  Physical Exam Vitals reviewed.  Constitutional:      General: He is awake.     Appearance: Normal appearance. He is well-developed and well-groomed.  HENT:     Head: Normocephalic and atraumatic.     Right Ear: Hearing, tympanic membrane and ear canal normal.     Left Ear: Hearing, tympanic membrane and ear canal normal.     Mouth/Throat:     Lips: Pink.     Mouth: Mucous membranes are moist.     Pharynx: Oropharynx is clear. Uvula midline. No oropharyngeal exudate or posterior oropharyngeal erythema.     Tonsils: No tonsillar exudate or tonsillar abscesses.  Eyes:     General: Lids are normal. Gaze aligned appropriately.     Extraocular Movements: Extraocular movements intact.     Conjunctiva/sclera: Conjunctivae normal.     Pupils: Pupils are equal, round, and reactive to light.  Neck:     Thyroid: No thyroid mass, thyromegaly or thyroid tenderness.  Cardiovascular:     Rate and Rhythm: Normal rate and regular rhythm.  Pulses: Normal pulses.          Radial pulses are 2+ on the right side and 2+ on the left side.     Heart sounds: Normal heart sounds. No murmur heard.    No friction rub. No gallop.  Pulmonary:     Effort: Pulmonary effort is normal.     Breath sounds: Normal breath sounds. No decreased air movement. No decreased breath sounds, wheezing, rhonchi or rales.   Abdominal:     General: Abdomen is protuberant. Bowel sounds are normal.     Palpations: Abdomen is soft.     Tenderness: There is no abdominal tenderness.  Musculoskeletal:     Cervical back: Normal range of motion and neck supple.     Right lower leg: No edema.     Left lower leg: No edema.  Lymphadenopathy:     Head:     Right side of head: No submental, submandibular or preauricular adenopathy.     Left side of head: No submental, submandibular or preauricular adenopathy.     Cervical:     Right cervical: No superficial or posterior cervical adenopathy.    Left cervical: No superficial or posterior cervical adenopathy.     Upper Body:     Right upper body: No supraclavicular adenopathy.     Left upper body: No supraclavicular adenopathy.  Skin:    General: Skin is warm and dry.  Neurological:     General: No focal deficit present.     Mental Status: He is alert and oriented to person, place, and time.     GCS: GCS eye subscore is 4. GCS verbal subscore is 5. GCS motor subscore is 6.     Cranial Nerves: No cranial nerve deficit, dysarthria or facial asymmetry.     Motor: No weakness, tremor, atrophy or abnormal muscle tone.     Gait: Gait is intact.  Psychiatric:        Attention and Perception: Attention and perception normal.        Mood and Affect: Mood and affect normal.        Speech: Speech normal.        Behavior: Behavior normal. Behavior is cooperative.        Thought Content: Thought content normal.        Cognition and Memory: Cognition normal.        Judgment: Judgment normal.      No results found for this or any previous visit (from the past 2160 hour(s)).   Fall Risk:    05/16/2023   11:11 AM 11/09/2022    2:08 PM 05/12/2022    1:43 PM 03/29/2022   10:52 AM 11/19/2020   10:35 AM  Fall Risk   Falls in the past year? 0 0 0 0 0  Number falls in past yr: 0 0 0 0   Injury with Fall? 0 0 0 0   Risk for fall due to : No Fall Risks No Fall Risks No Fall  Risks No Fall Risks   Follow up Falls evaluation completed Falls evaluation completed Falls evaluation completed Falls evaluation completed      Functional Status Survey:      Assessment & Plan  Problem List Items Addressed This Visit       Respiratory   COPD (chronic obstructive pulmonary disease) (HCC)    Chronic, potentially exacerbated He reports increased SOBOE and sweating more especially when active during hot weather EKG was normal today- results reviewed  with him during apt Will add Trelegy to regimen to assist with COPD management Reviewed Albuterol use If symptoms of SOB persist may need to refer to Cardiology and get Zio monitor for further work up Follow up in 3 months or sooner if concerns arise        Relevant Medications   Fluticasone-Umeclidin-Vilant (TRELEGY ELLIPTA) 100-62.5-25 MCG/ACT AEPB     Other   Hypercholesterolemia   Relevant Orders   Lipid Profile   Other Visit Diagnoses     Annual physical exam    -  Primary  -Prostate cancer screening and PSA options (with potential risks and benefits of testing vs not testing) were discussed along with recent recs/guidelines. -USPSTF grade A and B recommendations reviewed with patient; age-appropriate recommendations, preventive care, screening tests, etc discussed and encouraged; healthy living encouraged; see AVS for patient education given to patient -Discussed importance of 150 minutes of physical activity weekly, eat two servings of fish weekly, eat one serving of tree nuts ( cashews, pistachios, pecans, almonds.Marland Kitchen) every other day, eat 6 servings of fruit/vegetables daily and drink plenty of water and avoid sweet beverages.  -Reviewed Health Maintenance: yes    Relevant Orders   Comp Met (CMET)   CBC w/Diff   Lipid Profile   TSH   HgB A1c   PSA   Urinalysis, Routine w reflex microscopic   Shortness of breath     See COPD for A&P     Relevant Orders   EKG 12-Lead (Completed)   Screening for  lung cancer       Relevant Orders   Ambulatory Referral Lung Cancer Screening  Pulmonary        Return in about 4 weeks (around 06/13/2023) for Depression, back pain .   I, Synda Bagent E Jalia Zuniga, PA-C, have reviewed all documentation for this visit. The documentation on 05/16/23 for the exam, diagnosis, procedures, and orders are all accurate and complete.   Jacquelin Hawking, MHS, PA-C [facility]

## 2023-05-19 NOTE — Progress Notes (Signed)
Labs are normal/stable with the exception of your A1c Alc is a number that reflects the 3 month average of your blood glucose and is used to determine if someone has prediabetes or diabetes Your A1c was 5.8 which is in prediabetic range. No medications are indicated at this time but I do recommend lifestyle changes such as diet and exercise to help prevent further progression to type 2 diabetes.

## 2023-06-03 ENCOUNTER — Other Ambulatory Visit: Payer: Self-pay | Admitting: Nurse Practitioner

## 2023-06-06 ENCOUNTER — Other Ambulatory Visit: Payer: Self-pay | Admitting: Nurse Practitioner

## 2023-06-06 NOTE — Telephone Encounter (Signed)
Requested Prescriptions  Pending Prescriptions Disp Refills   rosuvastatin (CRESTOR) 10 MG tablet [Pharmacy Med Name: ROSUVASTATIN 10MG  TABLETS] 90 tablet 0    Sig: TAKE 1 TABLET(10 MG) BY MOUTH DAILY     Cardiovascular:  Antilipid - Statins 2 Failed - 06/03/2023  2:27 PM      Failed - Lipid Panel in normal range within the last 12 months    Cholesterol, Total  Date Value Ref Range Status  05/16/2023 122 100 - 199 mg/dL Final   LDL Chol Calc (NIH)  Date Value Ref Range Status  05/16/2023 65 0 - 99 mg/dL Final   HDL  Date Value Ref Range Status  05/16/2023 31 (L) >39 mg/dL Final   Triglycerides  Date Value Ref Range Status  05/16/2023 148 0 - 149 mg/dL Final         Passed - Cr in normal range and within 360 days    Creatinine  Date Value Ref Range Status  04/16/2013 1.18 0.60 - 1.30 mg/dL Final   Creatinine, Ser  Date Value Ref Range Status  05/16/2023 0.93 0.76 - 1.27 mg/dL Final         Passed - Patient is not pregnant      Passed - Valid encounter within last 12 months    Recent Outpatient Visits           3 weeks ago Annual physical exam   [place] [facility] Mecum, Erin E, PA-C   6 months ago Chronic obstructive pulmonary disease, unspecified COPD type (HCC)   [place] [facility] Larae Grooms, NP   1 year ago Annual physical exam   [place] [facility] Larae Grooms, NP   1 year ago Depression, major, single episode, severe [facility])   [place] [facility] Larae Grooms, NP   1 year ago Depression, major, single episode, severe (HCC)   [place] [facility] Larae Grooms, NP       Future Appointments             In 1 week Mecum, Oswaldo Conroy, PA-C [place] [facility], [facility]

## 2023-06-07 NOTE — Telephone Encounter (Signed)
Rx 05/09/23 #180 - too soon Requested Prescriptions  Pending Prescriptions Disp Refills   indomethacin (INDOCIN) 50 MG capsule [Pharmacy Med Name: INDOMETHACIN 50MG  CAPSULES] 180 capsule 0    Sig: TAKE 1 CAPSULE(50 MG) BY MOUTH TWICE DAILY AS NEEDED FOR GOUT FLARES     Analgesics:  NSAIDS Failed - 06/06/2023  6:20 AM      Failed - Manual Review: Labs are only required if the patient has taken medication for more than 8 weeks.      Passed - Cr in normal range and within 360 days    Creatinine  Date Value Ref Range Status  04/16/2013 1.18 0.60 - 1.30 mg/dL Final   Creatinine, Ser  Date Value Ref Range Status  05/16/2023 0.93 0.76 - 1.27 mg/dL Final         Passed - HGB in normal range and within 360 days    Hemoglobin  Date Value Ref Range Status  05/16/2023 14.7 13.0 - 17.7 g/dL Final         Passed - PLT in normal range and within 360 days    Platelets  Date Value Ref Range Status  05/16/2023 167 150 - 450 x10E3/uL Final         Passed - HCT in normal range and within 360 days    Hematocrit  Date Value Ref Range Status  05/16/2023 43.1 37.5 - 51.0 % Final         Passed - eGFR is 30 or above and within 360 days    EGFR (African American)  Date Value Ref Range Status  04/16/2013 >60  Final   GFR calc Af Amer  Date Value Ref Range Status  05/08/2020 93 >59 mL/min/1.73 Final    Comment:    **[facility] currently reports eGFR in compliance with the current**   recommendations of the [employer]. [facility] will   update reporting as new guidelines are published from the NKF-ASN   Task force.    EGFR (Non-African Amer.)  Date Value Ref Range Status  04/16/2013 >60  Final    Comment:    eGFR values <7mL/min/1.73 m2 may be an indication of chronic kidney disease (CKD). Calculated eGFR is useful in patients with stable renal function. The eGFR calculation will not be reliable in acutely ill patients when serum creatinine is changing rapidly. It is not  useful in  patients on dialysis. The eGFR calculation may not be applicable to patients at the low and high extremes of body sizes, pregnant women, and vegetarians.    GFR, Estimated  Date Value Ref Range Status  11/30/2021 >60 >60 mL/min Final    Comment:    (NOTE) Calculated using the CKD-EPI Creatinine Equation (2021)    eGFR  Date Value Ref Range Status  05/16/2023 97 >59 mL/min/1.73 Final         Passed - Patient is not pregnant      Passed - Valid encounter within last 12 months    Recent Outpatient Visits           3 weeks ago Annual physical exam   [place] [facility] Mecum, Erin E, PA-C   7 months ago Chronic obstructive pulmonary disease, unspecified COPD type (HCC)   [place] [facility] Larae Grooms, NP   1 year ago Annual physical exam   [place] [facility] Larae Grooms, NP   1 year ago Depression, major, single episode, severe (HCC)   [place] [facility] Larae Grooms, NP   1 year ago Depression, major, single episode, severe [facility])   [place] [facility] Larae Grooms, NP       Future Appointments             In 6 days Mecum, Oswaldo Conroy, PA-C  [facility], [facility]

## 2023-06-13 ENCOUNTER — Ambulatory Visit (INDEPENDENT_AMBULATORY_CARE_PROVIDER_SITE_OTHER): Payer: Managed Care, Other (non HMO) | Admitting: Physician Assistant

## 2023-06-13 ENCOUNTER — Encounter: Payer: Self-pay | Admitting: Physician Assistant

## 2023-06-13 VITALS — Ht 71.0 in | Wt 316.0 lb

## 2023-06-13 DIAGNOSIS — Z5320 Procedure and treatment not carried out because of patient's decision for unspecified reasons: Secondary | ICD-10-CM

## 2023-06-28 NOTE — Progress Notes (Signed)
Today's Vitals   06/13/23 0857  SpO2: 97%  Weight: (!) 316 lb (143.3 kg)  Height: 5\' 11"  (1.803 m)  PainSc: 0-No pain   Body mass index is 44.07 kg/m.   Patient did not wish to proceed with apt once provider got into exam room He expressed displeasure with previous visit charges and co-pay   When I got into the room he told me he was upset and thought "people come to the doctor to discuss their problems not be charged for everything." I pointed out that we have information regarding this in the rooms and today is not a physical so we can discuss whatever he would like. He stated that he was "fine" today so I asked if he wanted to discuss his mood and back pain as planned; again he said he was fine. I asked then, what he would like to talk about today and he reiterated that he didn't have anything to discuss and would like to schedule his physical for next year. I told him this could be done up front and he got up and left the room.

## 2023-07-15 ENCOUNTER — Other Ambulatory Visit: Payer: Self-pay | Admitting: Emergency Medicine

## 2023-07-15 DIAGNOSIS — Z87891 Personal history of nicotine dependence: Secondary | ICD-10-CM

## 2023-07-15 DIAGNOSIS — Z122 Encounter for screening for malignant neoplasm of respiratory organs: Secondary | ICD-10-CM

## 2023-07-29 ENCOUNTER — Ambulatory Visit: Payer: Managed Care, Other (non HMO) | Admitting: Physician Assistant

## 2023-07-29 ENCOUNTER — Encounter: Payer: Self-pay | Admitting: Physician Assistant

## 2023-07-29 DIAGNOSIS — Z87891 Personal history of nicotine dependence: Secondary | ICD-10-CM

## 2023-07-29 NOTE — Patient Instructions (Signed)

## 2023-07-29 NOTE — Progress Notes (Signed)
  Virtual Visit via Telephone Note  I connected with Walter Mcclain on 07/29/23 at 0930 by telephone and verified that I am speaking with the correct person using two identifiers.  Location: Patient: home Provider: working virtually from home   I discussed the limitations, risks, security and privacy concerns of performing an evaluation and management service by telephone and the availability of in person appointments. I also discussed with the patient that there may be a patient responsible charge related to this service. The patient expressed understanding and agreed to proceed.       Shared Decision Making Visit Lung Cancer Screening Program [phone])   Eligibility: Age 55 Pack Years Smoking History Calculation 35 (# packs/per year x # years smoked) Recent History of coughing up blood  no Unexplained weight loss? No ( >Than 15 pounds within the last 6 months ) Prior History Lung / other cancer No (Diagnosis within the last 5 years already requiring surveillance chest CT Scans). Smoking Status Former Smoker Former Smokers: Years since quit: 2 years  Quit Date: 2022  Visit Components: Discussion included one or more decision making aids? Yes Discussion included risk/benefits of screening. Yes Discussion included potential follow up diagnostic testing for abnormal scans. Yes Discussion included meaning and risk of over diagnosis. Yes Discussion included meaning and risk of False Positives. Yes Discussion included meaning of total radiation exposure. Yes  Counseling Included: Importance of adherence to annual lung cancer LDCT screening. Yes Impact of comorbidities on ability to participate in the program. Yes Ability and willingness to under diagnostic treatment. Yes  Smoking Cessation Counseling: Former Smokers:  Discussed the importance of maintaining cigarette abstinence. Yes Diagnosis Code: Personal History of Nicotine Dependence. X91.478 Information about tobacco  cessation classes and interventions provided to patient. Yes Written Order for Lung Cancer Screening with LDCT placed in Epic. Yes (CT Chest Lung Cancer Screening Low Dose W/O CM) [id number] Z12.2-Screening of respiratory organs Z87.891-Personal history of nicotine dependence    I spent 25 minutes of face to face time/virtual visit time  with the patient discussing the risks and benefits of lung cancer screening. We took the time to pause at intervals to allow for questions to be asked and answered to ensure understanding. We discussed that they had taken the single most powerful action possible to decrease their risk of developing lung cancer when they quit smoking. I counseled them to remain smoke free, and to contact the office if they ever had the desire to smoke again so that we can provide resources and tools to help support the effort to remain smoke free. We discussed the time and location of the scan, and they  will receive a call or letter with the results within  24-72 hours of receiving them. They have the office contact information in the event they have questions.   They verbalized understanding of all of the above and had no further questions.    I explained to the patient that there has been a high incidence of coronary artery disease noted on these exams. I explained that this is a non-gated exam therefore degree or severity cannot be determined. This patient is on statin therapy. I have asked the patient to follow-up with their PCP regarding any incidental finding of coronary artery disease and management with diet or medication as they feel is clinically indicated. The patient verbalized understanding of the above and had no further questions.      Darcella Gasman Boen Sterbenz, PA-C

## 2023-08-01 ENCOUNTER — Ambulatory Visit
Admission: RE | Admit: 2023-08-01 | Discharge: 2023-08-01 | Disposition: A | Discharge status: Home / Self Care | Payer: Managed Care, Other (non HMO) | Source: Ambulatory Visit | Attending: Acute Care | Admitting: Acute Care

## 2023-08-01 DIAGNOSIS — Z87891 Personal history of nicotine dependence: Secondary | ICD-10-CM

## 2023-08-01 DIAGNOSIS — Z122 Encounter for screening for malignant neoplasm of respiratory organs: Secondary | ICD-10-CM

## 2023-08-25 ENCOUNTER — Other Ambulatory Visit: Payer: Self-pay | Admitting: Acute Care

## 2023-08-25 DIAGNOSIS — Z122 Encounter for screening for malignant neoplasm of respiratory organs: Secondary | ICD-10-CM

## 2023-08-25 DIAGNOSIS — Z87891 Personal history of nicotine dependence: Secondary | ICD-10-CM

## 2023-11-30 IMAGING — CT CT RENAL STONE PROTOCOL
2 of 4 series · 16 of 46 positions shown, 18 images · non-contrast
Comparison: CT April 16, 2013

CLINICAL DATA: Flank pain, kidney stone suspected.



[Series 2: stone full standard · axial · 0.88mm/px · z∈[-960,-475]mm · 13 of 107 slices shown, 15 images]
[im 5/107  soft-tissue]
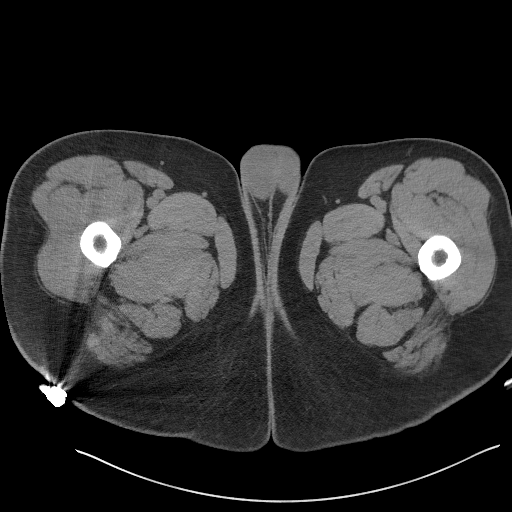
[im 5/107  bone]
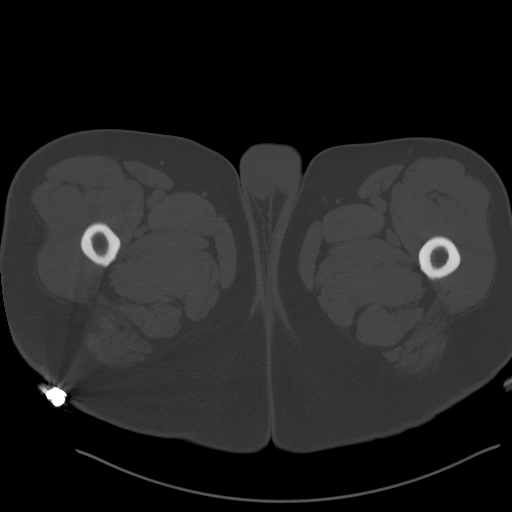
[im 13/107  soft-tissue]
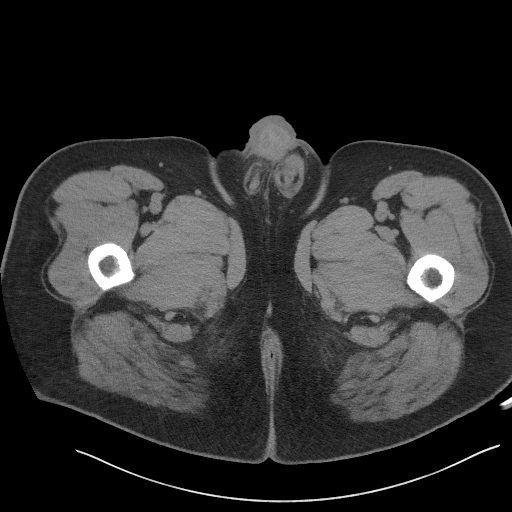
[im 22/107  soft-tissue]
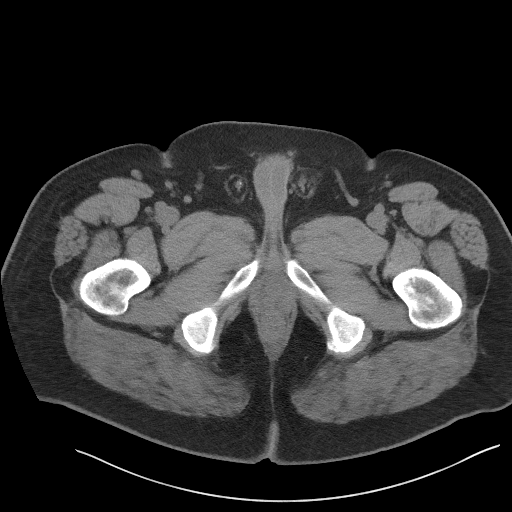
[im 30/107  soft-tissue]
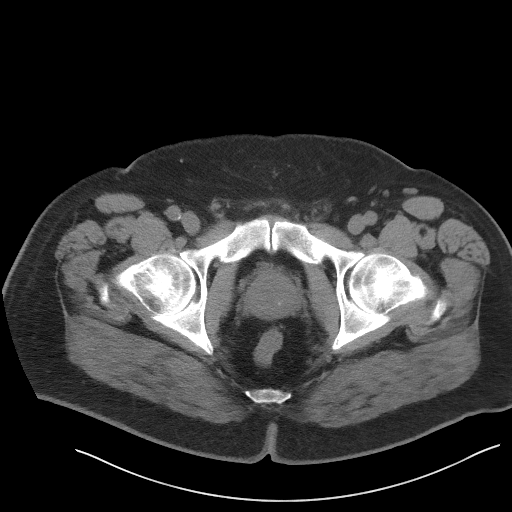
[im 39/107  soft-tissue]
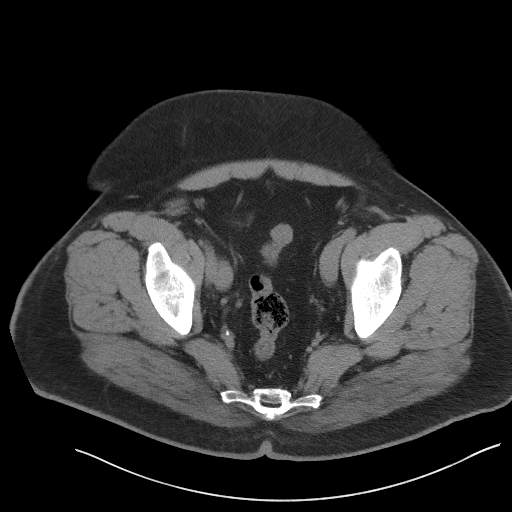
[im 47/107  soft-tissue]
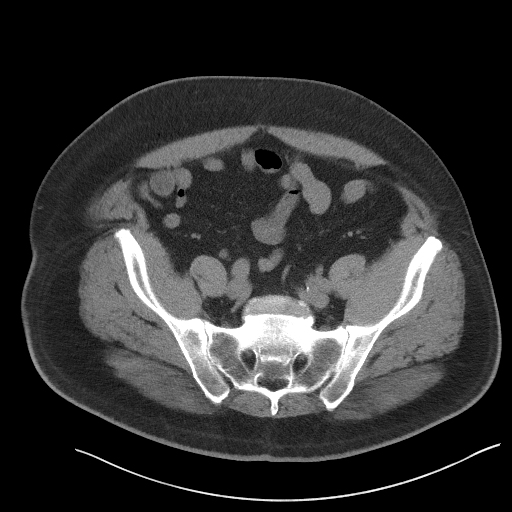
[im 56/107  soft-tissue]
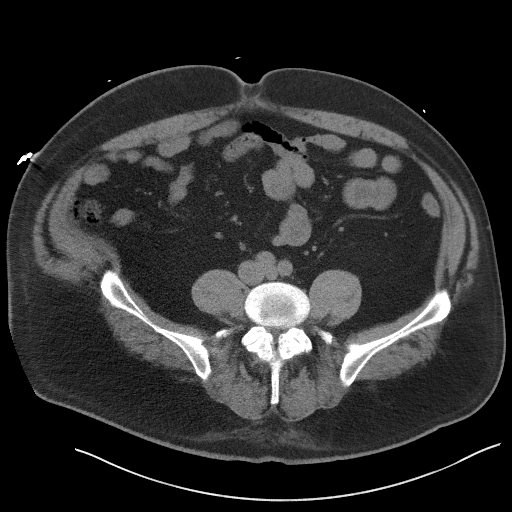
[im 60/107  soft-tissue]
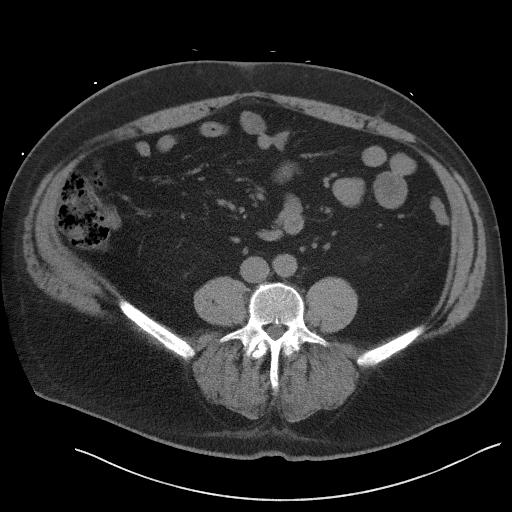
[im 68/107  soft-tissue]
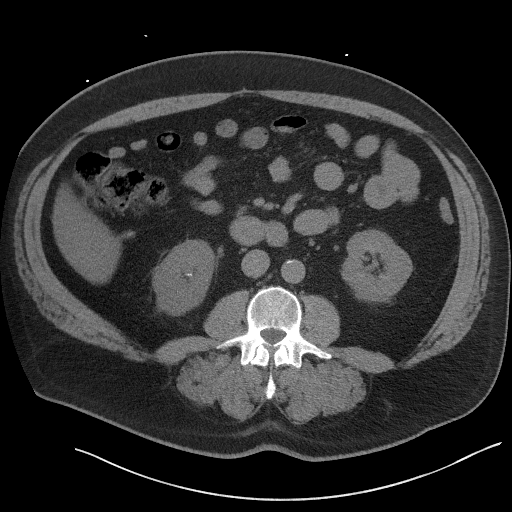
[im 68/107  bone]
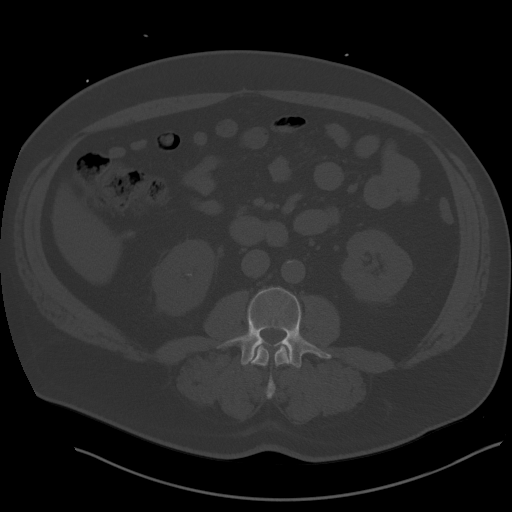
[im 77/107  soft-tissue]
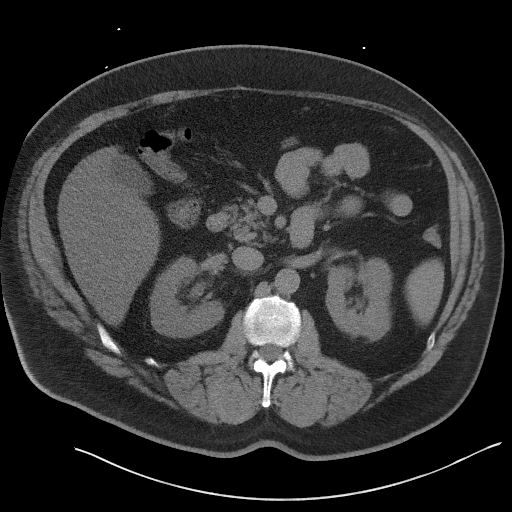
[im 85/107  soft-tissue]
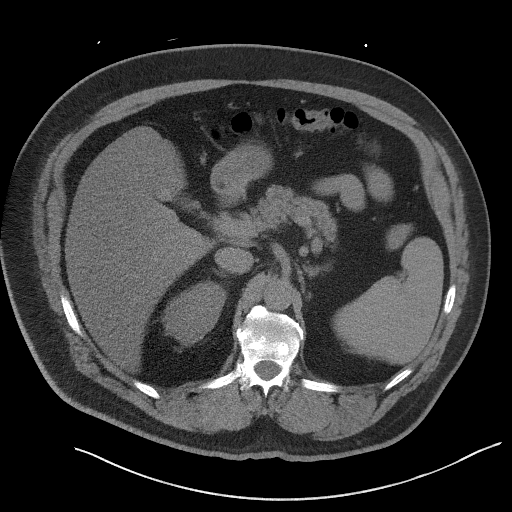
[im 94/107  soft-tissue]
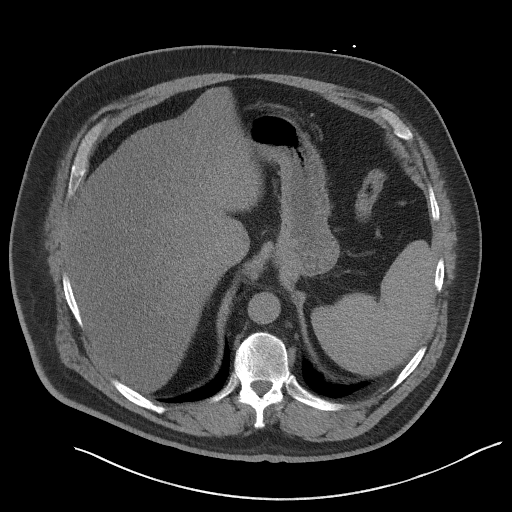
[im 102/107  soft-tissue]
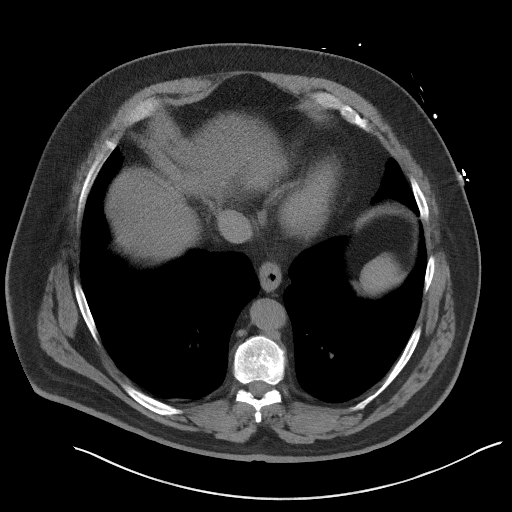

[Series 4: coronal · coronal · 0.93mm/px · 3 of 185 slices shown]
[im 62/185  soft-tissue]
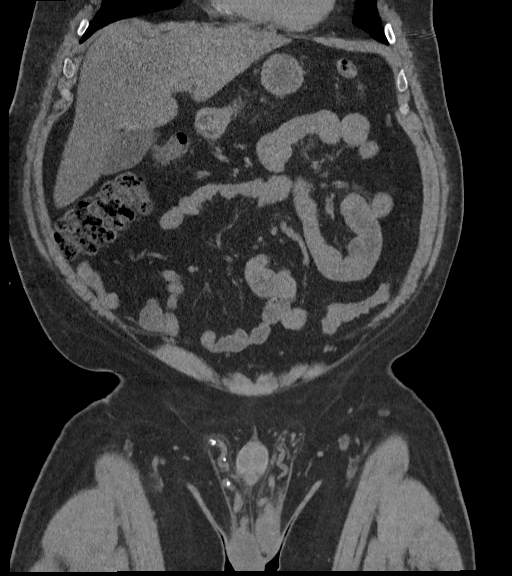
[im 82/185  soft-tissue]
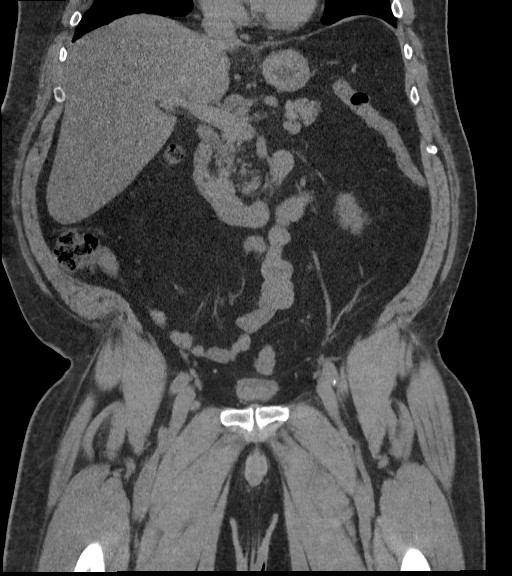
[im 103/185  soft-tissue]
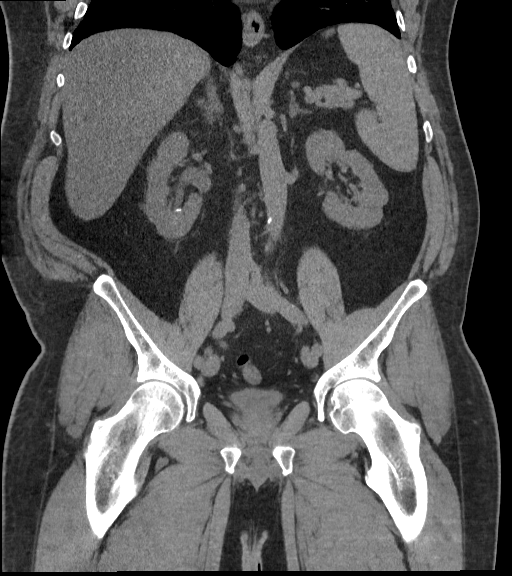

[16 of 46 positions shown; findings below may reference images not displayed]

FINDINGS: Lower chest: No acute abnormality.

Hepatobiliary: Hepatic steatosis with focal fatty sparing along the
gallbladder fossa, hepatic hilum and falciform ligament.
Cholelithiasis without findings of acute cholecystitis. No biliary
ductal dilation.

Pancreas: No pancreatic ductal dilation or evidence of acute
inflammation.

Spleen: Within normal limits.

Adrenals/Urinary Tract: Bilateral adrenal glands appear normal.

Mild RIGHT hydroureteronephrosis to the level of a 3.5 mm stone in
the UVJ or layering dependently in the urinary bladder on image
75/2. No left-sided hydronephrosis. Three additional punctate 1 mm
nonobstructive right renal stones. Lobular renal contour is stable
dating back to 4971.

Stomach/Bowel: No enteric contrast was administered. Small hiatal
hernia otherwise the stomach is unremarkable for degree of
distension. No pathologic dilation of small or large bowel. The
appendix and terminal ileum appear normal. No evidence of acute
bowel inflammation.

Vascular/Lymphatic: Normal caliber abdominal aorta. Aortic
atherosclerosis. No pathologically enlarged abdominal or pelvic
lymph nodes.

Reproductive: Mild enlargement of prostate gland. Bilateral
varicoceles.

Other: No significant abdominopelvic free fluid.

Musculoskeletal: Degenerative change of the SI joints with partial
bony ankylosis of the left SI joint. Chronic bilateral L5 pars
defects with grade [DATE] anterolisthesis of L5 on S1. Thoracolumbar
spondylosis. No acute osseous abnormality.
IMPRESSION: 1. Mild RIGHT hydroureteronephrosis to the level of a 3.5 mm stone
which is either in the UVJ or layering dependently in the urinary
bladder.
2. Three additional punctate 1 mm nonobstructive right renal stones.
3. Hepatic steatosis.
4. Cholelithiasis without findings of acute cholecystitis.
5. Aortic Atherosclerosis ([id number]).

## 2024-01-12 ENCOUNTER — Other Ambulatory Visit: Payer: Self-pay | Admitting: Nurse Practitioner

## 2024-01-12 NOTE — Telephone Encounter (Signed)
 Copied from CRM [id number]. Topic: Clinical - Medication Refill >> Jan 12, 2024 12:44 PM Fuller Mandril wrote: Most Recent Primary Care Visit:  Provider: Providence Crosby  Department: [facility]  Visit Type: OFFICE VISIT  Date: 06/13/2023  Medication: ***  Has the patient contacted their pharmacy? Yes (Agent: If no, request that the patient contact the pharmacy for the refill. If patient does not wish to contact the pharmacy document the reason why and proceed with request.) (Agent: If yes, when and what did the pharmacy advise?) Has not been called in   Is this the correct pharmacy for this prescription?  If no, delete pharmacy and type the correct one.  This is the patient's preferred pharmacy:  [facility] #[id number], [place] - [address] AT [facility] [address] [place] [address] Phone: [phone] Fax: [phone]   Has the prescription been filled recently? No  Is the patient out of the medication? Yes  Has the patient been seen for an appointment in the last year OR does the patient have an upcoming appointment? Yes  Can we respond through MyChart? Yes  Agent: Please be advised that Rx refills may take up to 3 business days. We ask that you follow-up with your pharmacy.

## 2024-01-12 NOTE — Telephone Encounter (Signed)
 Copied from CRM [id number]. Topic: Clinical - Medication Refill >> Jan 12, 2024 12:44 PM Fuller Mandril wrote: Most Recent Primary Care Visit:  Provider: Providence Crosby  Department: [facility]  Visit Type: OFFICE VISIT  Date: 06/13/2023  Medication: rosuvastatin (CRESTOR) 10 MG tablet /indomethacin (INDOCIN) 50 MG capsule   Has the patient contacted their pharmacy? Yes (Agent: If no, request that the patient contact the pharmacy for the refill. If patient does not wish to contact the pharmacy document the reason why and proceed with request.) (Agent: If yes, when and what did the pharmacy advise?) Has not been called in   Is this the correct pharmacy for this prescription? Yes If no, delete pharmacy and type the correct one.  This is the patient's preferred pharmacy:  [facility] #[id number] [facility], [place] - [address] AT [facility] [address] [place] [address] Phone: [phone] Fax: [phone]   Has the prescription been filled recently? No  Is the patient out of the medication? Yes  Has the patient been seen for an appointment in the last year OR does the patient have an upcoming appointment? Yes  Can we respond through MyChart? Yes  Agent: Please be advised that Rx refills may take up to 3 business days. We ask that you follow-up with your pharmacy.

## 2024-01-13 MED ORDER — ROSUVASTATIN CALCIUM 10 MG PO TABS: 10.0000 mg | ORAL_TABLET | Freq: Every day | ORAL | 0 refills | Status: DC

## 2024-01-13 MED ORDER — INDOMETHACIN 50 MG PO CAPS: 50.0000 mg | ORAL_CAPSULE | Freq: Two times a day (BID) | ORAL | 0 refills | Status: DC | PRN

## 2024-01-13 NOTE — Telephone Encounter (Signed)
 Duplicate request-filled 01/13/24 Requested Prescriptions  Pending Prescriptions Disp Refills   rosuvastatin (CRESTOR) 10 MG tablet [Pharmacy Med Name: ROSUVASTATIN 10MG  TABLETS] 90 tablet 0    Sig: TAKE 1 TABLET(10 MG) BY MOUTH DAILY     There is no refill protocol information for this order

## 2024-01-13 NOTE — Telephone Encounter (Signed)
 OV 05/24/23 Requested Prescriptions  Pending Prescriptions Disp Refills   indomethacin (INDOCIN) 50 MG capsule 180 capsule 0    Sig: Take 1 capsule (50 mg total) by mouth 2 (two) times daily as needed.     Analgesics:  NSAIDS Failed - 01/13/2024  3:29 PM      Failed - Manual Review: Labs are only required if the patient has taken medication for more than 8 weeks.      Failed - Valid encounter within last 12 months    Recent Outpatient Visits   None            Passed - Cr in normal range and within 360 days    Creatinine  Date Value Ref Range Status  04/16/2013 1.18 0.60 - 1.30 mg/dL Final   Creatinine, Ser  Date Value Ref Range Status  05/16/2023 0.93 0.76 - 1.27 mg/dL Final         Passed - HGB in normal range and within 360 days    Hemoglobin  Date Value Ref Range Status  05/16/2023 14.7 13.0 - 17.7 g/dL Final         Passed - PLT in normal range and within 360 days    Platelets  Date Value Ref Range Status  05/16/2023 167 150 - 450 x10E3/uL Final         Passed - HCT in normal range and within 360 days    Hematocrit  Date Value Ref Range Status  05/16/2023 43.1 37.5 - 51.0 % Final         Passed - eGFR is 30 or above and within 360 days    EGFR (African American)  Date Value Ref Range Status  04/16/2013 >60  Final   GFR calc Af Amer  Date Value Ref Range Status  05/08/2020 93 >59 mL/min/1.73 Final    Comment:    **[facility] currently reports eGFR in compliance with the current**   recommendations of the [employer]. [facility] will   update reporting as new guidelines are published from the NKF-ASN   Task force.    EGFR (Non-African Amer.)  Date Value Ref Range Status  04/16/2013 >60  Final    Comment:    eGFR values <5mL/min/1.73 m2 may be an indication of chronic kidney disease (CKD). Calculated eGFR is useful in patients with stable renal function. The eGFR calculation will not be reliable in acutely ill patients when serum creatinine  is changing rapidly. It is not useful in  patients on dialysis. The eGFR calculation may not be applicable to patients at the low and high extremes of body sizes, pregnant women, and vegetarians.    GFR, Estimated  Date Value Ref Range Status  11/30/2021 >60 >60 mL/min Final    Comment:    (NOTE) Calculated using the CKD-EPI Creatinine Equation (2021)    eGFR  Date Value Ref Range Status  05/16/2023 97 >59 mL/min/1.73 Final         Passed - Patient is not pregnant       rosuvastatin (CRESTOR) 10 MG tablet 90 tablet 0    Sig: Take 1 tablet (10 mg total) by mouth daily.     Cardiovascular:  Antilipid - Statins 2 Failed - 01/13/2024  3:29 PM      Failed - Valid encounter within last 12 months    Recent Outpatient Visits   None            Failed - Lipid Panel in normal range within the  last 12 months    Cholesterol, Total  Date Value Ref Range Status  05/16/2023 122 100 - 199 mg/dL Final   LDL Chol Calc (NIH)  Date Value Ref Range Status  05/16/2023 65 0 - 99 mg/dL Final   HDL  Date Value Ref Range Status  05/16/2023 31 (L) >39 mg/dL Final   Triglycerides  Date Value Ref Range Status  05/16/2023 148 0 - 149 mg/dL Final         Passed - Cr in normal range and within 360 days    Creatinine  Date Value Ref Range Status  04/16/2013 1.18 0.60 - 1.30 mg/dL Final   Creatinine, Ser  Date Value Ref Range Status  05/16/2023 0.93 0.76 - 1.27 mg/dL Final         Passed - Patient is not pregnant

## 2024-04-08 ENCOUNTER — Other Ambulatory Visit: Payer: Self-pay

## 2024-04-08 ENCOUNTER — Emergency Department
Admission: EM | Admit: 2024-04-08 | Discharge: 2024-04-08 | Disposition: A | Discharge status: Home / Self Care | Attending: Emergency Medicine | Admitting: Emergency Medicine

## 2024-04-08 DIAGNOSIS — L03115 Cellulitis of right lower limb: Secondary | ICD-10-CM | POA: Insufficient documentation

## 2024-04-08 DIAGNOSIS — L02415 Cutaneous abscess of right lower limb: Secondary | ICD-10-CM | POA: Insufficient documentation

## 2024-04-08 DIAGNOSIS — J449 Chronic obstructive pulmonary disease, unspecified: Secondary | ICD-10-CM | POA: Insufficient documentation

## 2024-04-08 DIAGNOSIS — L02419 Cutaneous abscess of limb, unspecified: Secondary | ICD-10-CM

## 2024-04-08 MED ORDER — DOXYCYCLINE HYCLATE 50 MG PO CAPS
100.0000 mg | ORAL_CAPSULE | Freq: Two times a day (BID) | ORAL | 0 refills | Status: DC
Start: 2024-04-08 — End: 2024-04-08

## 2024-04-08 MED ORDER — OXYCODONE HCL 5 MG PO TABS: 5.0000 mg | ORAL_TABLET | Freq: Three times a day (TID) | ORAL | 0 refills | Status: DC | PRN

## 2024-04-08 MED ORDER — CEPHALEXIN 500 MG PO CAPS: 500.0000 mg | ORAL_CAPSULE | Freq: Four times a day (QID) | ORAL | 0 refills | Status: DC

## 2024-04-08 MED ORDER — LIDOCAINE-EPINEPHRINE 1 %-1:100000 IJ SOLN
20.0000 mL | Freq: Once | INTRAMUSCULAR | Status: AC
  Administered 2024-04-08: 20 mL
  Filled 2024-04-08: qty 20
  Filled 2024-04-08: qty 1

## 2024-04-08 MED ORDER — SULFAMETHOXAZOLE-TRIMETHOPRIM 800-160 MG PO TABS
1.0000 | ORAL_TABLET | Freq: Two times a day (BID) | ORAL | 0 refills | Status: AC
Start: 2024-04-08 — End: 2024-04-15

## 2024-04-08 MED ORDER — DOXYCYCLINE HYCLATE 100 MG PO TABS
100.0000 mg | ORAL_TABLET | Freq: Once | ORAL | Status: AC
  Administered 2024-04-08: 100 mg via ORAL
  Filled 2024-04-08: qty 1

## 2024-04-08 MED ORDER — CEPHALEXIN 500 MG PO CAPS
500.0000 mg | ORAL_CAPSULE | Freq: Once | ORAL | Status: AC
  Administered 2024-04-08: 500 mg via ORAL
  Filled 2024-04-08: qty 1

## 2024-04-08 MED ORDER — OXYCODONE HCL 5 MG PO TABS
10.0000 mg | ORAL_TABLET | Freq: Once | ORAL | Status: AC
  Administered 2024-04-08: 10 mg via ORAL
  Filled 2024-04-08: qty 2

## 2024-04-08 NOTE — ED Notes (Signed)
 Pt states he noticed a small wound on his right knee on saturday and didn't think much of it at first but, when it did not go away he believed it might be MRSA and he began to clean it with hydrogen peroxide. The pt states that is it hard to walk on and he began noticing knee was swelling. Due to same he believed it was best to go to the hospital.  The pt is noted to have open wound on his right knee.

## 2024-04-08 NOTE — ED Provider Notes (Addendum)
 [facility] Provider Note    Event Date/Time   First MD Initiated Contact with Patient 04/08/24 [phone]     (approximate)   History   Knee Pain   HPI  Walter Mcclain. is a 58 y.o. male   Past medical history of COPD, depression Who presents emergency department with draining wound of his right knee.  Anterior knee started out with a little lesion that looked at a pimple and it got larger over the last several days and started draining purulence fluid.  There are some surrounding redness to the area as well.  He is able to walk and range the knee.  He does not have systemic signs of infection like fevers or chills.  External Medical Documents Reviewed: Prior outpatient family medicine notes documenting past medical history      Physical Exam   Triage Vital Signs: ED Triage Vitals  Encounter Vitals Group     BP 04/08/24 0443 (!) 150/98     Girls Systolic BP Percentile --      Girls Diastolic BP Percentile --      Boys Systolic BP Percentile --      Boys Diastolic BP Percentile --      Pulse Rate 04/08/24 0443 98     Resp 04/08/24 0443 18     Temp 04/08/24 0443 97.6 F (36.4 C)     Temp src --      SpO2 04/08/24 0443 98 %     Weight 04/08/24 0444 290 lb (131.5 kg)     Height 04/08/24 0444 5' 11 (1.803 m)     Head Circumference --      Peak Flow --      Pain Score 04/08/24 0444 9     Pain Loc --      Pain Education --      Exclude from Growth Chart --     Most recent vital signs: Vitals:   04/08/24 0443  BP: (!) 150/98  Pulse: 98  Resp: 18  Temp: 97.6 F (36.4 C)  SpO2: 98%    General: Awake, no distress.  CV:  Good peripheral perfusion.  Resp:  Normal effort.  Abd:  No distention.  Other:  Anterior left knee just below the patella has a open wound with some scant purulent drainage, and cellulitic changes surrounding approximately 1 cm rating outwards from the wound.  He is able to range fully at the knee and is neurovascular  intact to the extremity.  He is afebrile and slightly hypertensive otherwise vital signs are normal.     ED Results / Procedures / Treatments   Labs (all labs ordered are listed, but only abnormal results are displayed) Labs Reviewed - No data to display  PROCEDURES:  Critical Care performed: No  .Incision and Drainage  Date/Time: 04/08/2024 5:20 AM  Performed by: Cyrena Mylar, MD Authorized by: Cyrena Mylar, MD   Consent:    Consent obtained:  Verbal   Consent given by:  Patient   Alternatives discussed:  No treatment Universal protocol:    Patient identity confirmed:  Verbally with patient Location:    Type:  Abscess   Size:  1   Location:  Lower extremity   Lower extremity location:  Knee   Knee location:  R knee Pre-procedure details:    Skin preparation:  Povidone-iodine Anesthesia:    Anesthesia method:  Local infiltration   Local anesthetic:  Lidocaine  1% WITH epi Procedure type:  Complexity:  Complex Procedure details:    Incision types:  Stab incision   Drainage:  Purulent   Drainage amount:  Scant   Wound treatment:  Wound left open   Packing materials:  None Post-procedure details:    Procedure completion:  Tolerated   MEDICATIONS ORDERED IN ED: Medications  lidocaine -EPINEPHrine (XYLOCAINE  W/EPI) 1 %-1:100000 (with pres) injection 20 mL (20 mLs Other Given 04/08/24 0527)  cephALEXin (KEFLEX) capsule 500 mg (500 mg Oral Given 04/08/24 0526)  doxycycline (VIBRA-TABS) tablet 100 mg (100 mg Oral Given 04/08/24 0526)  oxyCODONE  (Oxy IR/ROXICODONE ) immediate release tablet 10 mg (10 mg Oral Given 04/08/24 0610)     IMPRESSION / MDM / ASSESSMENT AND PLAN / ED COURSE  I reviewed the triage vital signs and the nursing notes.                                Patient's presentation is most consistent with acute presentation with potential threat to life or bodily function.  Differential diagnosis includes, but is not limited to, abscess and cellulitis,  considered but doubt necrotizing fasciitis or joint space infection   The patient is on the cardiac monitor to evaluate for evidence of arrhythmia and/or significant heart rate changes.  MDM:    Cellulitis and abscess that has already spontaneously drained.  Considered prepatellar bursitis, septic joint, but think this is more surface level given the fact that it started as what looked like a small skin pimple at its onset. Able to fully range at the knee so I doubt this is a septic joint.  After local anesthesia applied with lidocaine  and epinephrine, a very small stab incision with an 11 blade was made overlying the area of most fluctuance and I was able to get a moderate amount of pus to be drained while playing steady pressure around the abscess.  I did not go much deeper or do an extensive probing and the loculation given the proximity tendon/bursa/joint underneath.   He tolerated the procedure.  There are no systemic signs of infection and doubt sepsis in this otherwise well-appearing nontoxic looking patient.     Patient looks well enough for outpatient management with oral antibiotics and knows to return with any new or worsening symptoms.  Follow-up with PMD.       FINAL CLINICAL IMPRESSION(S) / ED DIAGNOSES   Final diagnoses:  Cellulitis and abscess of leg     Rx / DC Orders   ED Discharge Orders          Ordered    cephALEXin (KEFLEX) 500 MG capsule  4 times daily,   Status:  Discontinued        04/08/24 0519    doxycycline (VIBRAMYCIN) 50 MG capsule  2 times daily,   Status:  Discontinued        04/08/24 0519    sulfamethoxazole-trimethoprim (BACTRIM DS) 800-160 MG tablet  2 times daily        04/08/24 0610    oxyCODONE  (ROXICODONE ) 5 MG immediate release tablet  Every 8 hours PRN        04/08/24 9388             Note:  This document was prepared using Dragon voice recognition software and may include unintentional dictation errors.    Cyrena Mylar,  MD 04/08/24 9387    Cyrena Mylar, MD 04/08/24 [phone]

## 2024-04-08 NOTE — Discharge Instructions (Addendum)
 Please keep your wound clean by washing at least daily with soap and water.  Apply antibiotic ointment and a bandage daily. If you see any signs of infection like spreading redness, more swelling, extensive pus coming from the wound, extreme pain, fevers, chills or any other worsening doctor right away or come back to the emergency department.  Take antibiotics for the full 7-day course as prescribed.  Take acetaminophen  650 mg and ibuprofen 400 mg every 6 hours for pain.  Take with food. Apply ice to help with pain and swelling.  For cases of severe/breakthrough pain you may use oxycodone  as prescribed.  This medication may make you sleepy, cause upset stomach, or cause constipation.  Thank you for choosing us  for your health care today!  Please see your primary doctor this week for a follow up appointment.   If you have any new, worsening, or unexpected symptoms call your doctor right away or come back to the emergency department for reevaluation.  It was my pleasure to care for you today.   Ginnie EDISON Cyrena, MD

## 2024-04-08 NOTE — ED Triage Notes (Signed)
 Pt presented to ED with c/o right knee pain. States on Saturday right knee tender to touch, Monday he noticed a fluid pocket on knee. States on Wednesday area began draining. States pain has become unbearable at this point bringing him to ED. Last took Aleve at 2200. Denies fevers, nausea, vomiting. Denies recent injury.

## 2024-04-11 ENCOUNTER — Other Ambulatory Visit: Payer: Self-pay | Admitting: Nurse Practitioner

## 2024-04-12 NOTE — Telephone Encounter (Signed)
 Requested Prescriptions  Pending Prescriptions Disp Refills   rosuvastatin  (CRESTOR ) 10 MG tablet [Pharmacy Med Name: ROSUVASTATIN  10MG  TABLETS] 90 tablet 0    Sig: TAKE 1 TABLET(10 MG) BY MOUTH DAILY     Cardiovascular:  Antilipid - Statins 2 Failed - 04/12/2024  2:37 PM      Failed - Valid encounter within last 12 months    Recent Outpatient Visits   None            Failed - Lipid Panel in normal range within the last 12 months    Cholesterol, Total  Date Value Ref Range Status  05/16/2023 122 100 - 199 mg/dL Final   LDL Chol Calc (NIH)  Date Value Ref Range Status  05/16/2023 65 0 - 99 mg/dL Final   HDL  Date Value Ref Range Status  05/16/2023 31 (L) >39 mg/dL Final   Triglycerides  Date Value Ref Range Status  05/16/2023 148 0 - 149 mg/dL Final         Passed - Cr in normal range and within 360 days    Creatinine  Date Value Ref Range Status  04/16/2013 1.18 0.60 - 1.30 mg/dL Final   Creatinine, Ser  Date Value Ref Range Status  05/16/2023 0.93 0.76 - 1.27 mg/dL Final         Passed - Patient is not pregnant

## 2024-06-06 ENCOUNTER — Ambulatory Visit: Admitting: Pediatrics

## 2024-06-06 VITALS — BP 147/91 | HR 56 | Temp 97.5°F | Ht 71.0 in | Wt 303.0 lb

## 2024-06-06 DIAGNOSIS — Z1330 Encounter for screening examination for mental health and behavioral disorders, unspecified: Secondary | ICD-10-CM

## 2024-06-06 DIAGNOSIS — R030 Elevated blood-pressure reading, without diagnosis of hypertension: Secondary | ICD-10-CM

## 2024-06-06 DIAGNOSIS — Z13220 Encounter for screening for lipoid disorders: Secondary | ICD-10-CM

## 2024-06-06 DIAGNOSIS — Z131 Encounter for screening for diabetes mellitus: Secondary | ICD-10-CM

## 2024-06-06 DIAGNOSIS — Z0000 Encounter for general adult medical examination without abnormal findings: Secondary | ICD-10-CM

## 2024-06-06 DIAGNOSIS — Z125 Encounter for screening for malignant neoplasm of prostate: Secondary | ICD-10-CM

## 2024-06-06 NOTE — Progress Notes (Signed)
 BP (!) 147/91   Pulse (!) 56   Temp (!) 97.5 F (36.4 C) (Oral)   Ht 5' 11 (1.803 m)   Wt (!) 303 lb (137.4 kg)   SpO2 95%   BMI 42.26 kg/m    Annual Physical Exam - Male  Subjective:   CC: Annual Exam   Walter Mcclain. is a 58 y.o. male patient here for a preventative health maintenance exam and has no acute complaints  Health Habits: DIET: in general, an unhealthy diet EXERCISE: limited times/week on average DENTAL EXAM: UTD EYE EXAM: requesting referral to opto                         Sex History:  Sexually active : Yes   Barrier protection : n/a       Family History  Problem Relation Age of Onset   Brain cancer Mother    Liver disease Father    Heart disease Maternal Grandmother    Liver disease Paternal Grandmother    Other Paternal Grandfather        MVA                      Social History   Tobacco Use   Smoking status: Every Day    Current packs/day: 0.00    Average packs/day: 1 pack/day for 35.0 years (35.0 ttl pk-yrs)    Types: Cigarettes    Last attempt to quit: 01/16/2021    Years since quitting: 3.4   Smokeless tobacco: Former  Building services engineer status: Never Used  Substance Use Topics   Alcohol use: No   Drug use: Not Currently    Types: Marijuana    Comment: Patient states he does a Gummy occasionally   Social History   Social History Narrative   Not on file    Social drivers questionnaire is reviewed and is positive for : none     06/06/2024   10:40 AM 06/13/2023    9:01 AM 05/16/2023   11:10 AM 11/09/2022    2:08 PM 05/12/2022    1:43 PM  Depression screen PHQ 2/9  Decreased Interest 0 0 2 2 2   Down, Depressed, Hopeless 0 0 1 1 1   PHQ - 2 Score 0 0 3 3 3   Altered sleeping 1 0 1 0 1  Tired, decreased energy 0 0 0 2 1  Change in appetite 0 0 2 0 0  Feeling bad or failure about yourself  0 0 2 2 1   Trouble concentrating 0 0 0 0 1  Moving slowly or fidgety/restless 0 0 0 0 1  Suicidal thoughts 0 0 0 0 1  PHQ-9  Score 1 0 8 7 9   Difficult doing work/chores Not difficult at all Not difficult at all  Somewhat difficult Not difficult at all        06/06/2024   10:41 AM 06/13/2023    9:02 AM 05/16/2023   11:11 AM 11/09/2022    2:08 PM  GAD 7 : Generalized Anxiety Score  Nervous, Anxious, on Edge 0 0 1 2  Control/stop worrying 0 0 0 1  Worry too much - different things 0 0 0 2  Trouble relaxing 0 0 0 3  Restless 0 0 0 3  Easily annoyed or irritable 1 0 1 2  Afraid - awful might happen 0 0 0 1  Total GAD 7 Score 1 0 2 14  Anxiety Difficulty Not difficult at all Not difficult at all  Very difficult      Depression Severity and Treatment Recommendations:  0-4= None  5-9= Mild / Treatment: Support, educate to call if worse; return in one month  10-14= Moderate / Treatment: Support, watchful waiting; Antidepressant or Psychotherapy  15-19= Moderately severe / Treatment: Antidepressant OR Psychotherapy  >= 20= Major depression, severe / Antidepressant AND Psychotherapy  Mental Health Plan: CTM  Health Maintenance Colon Cancer Screening : UTD, next due 2028 Prostate Cancer Screening :  Prostate cancer screening and PSA options (with potential risks and benefits of testing vs. not testing) were discussed along with recent recs/guidelines. Declines this year,WNL last year. Immunizations : declined  Self Management Goals  Goals      SW-Manage My Emotions     Timeframe:  Long-Range Goal Priority:  Medium Start Date: 10/29/20                            Expected End Date: 12/27/20                     Follow Up Date- 90 days from 10/29/20   - begin personal counseling - call and visit an old friend - check out volunteer opportunities - join a support group - laugh; watch a funny movie or comedian - learn and use visualization or guided imagery - perform a random act of kindness - practice relaxation or meditation daily - start or continue a personal journal - talk about feelings with a friend,  family or spiritual advisor - practice positive thinking and self-talk    Why is this important?   When you are stressed, down or upset, your body reacts too.  For example, your blood pressure may get higher; you may have a headache or stomachache.  When your emotions get the best of you, your body's ability to fight off cold and flu gets weak.  These steps will help you manage your emotions.    Clinical Social Work Goal(s):  Over the next 120 days, patient/caregiver will work with SW to address concerns related to care coordination needs, lack of a stable support network and lack of Economist. LCSW will assist patient in gaining additional support in order to maintain health and mental health appropriately  Over the next 120 days, patient will demonstrate improved adherence to self care as evidenced by implementing healthy self-care into his daily routine such as: attending all medical appointments, deep breathing exercises, taking time for self-reflection, taking medications as prescribed, drinking water and daily exercise to improve mobility and mood.  Over the next 120 days, patient will demonstrate improved health management independence as evidenced by implementing healthy self-care skills and positive support/resources into his daily routine to help cope with stressors and improve overall health and well-being  Over the next 120 days, patient or caregiver will verbalize basic understanding of depression/stress process and self health management plan as evidenced by his participation in development of long term plan of care and institution of self health management strategies     Interventions: Patient interviewed and appropriate assessments performed: brief mental health assessment Patient reports that he and his spouse are interested in gaining martial therapy. Patient and spouse both have [employer].  Patient shares that PCP put him on a new  medication to help treat his depressive symptoms. He recently started this and is looking forward to gaining some  relief. Anxiety management coping skill education completed Provided mental health counseling and motivational interviewing intervention with regard to anxiety management/relapse prevention. Provided patient with information about available mental health support resources within his area that provide counseling for couples: [facility], [employer], [employer] and [employer]. LCSW provided education on healthy self care and what that looks like. Advised patient to implement deep breathing/grounding/meditation/self-care exercises into his daily routine to combat racing thoughts.  Discussed plans with patient for ongoing care management follow up and provided patient with direct contact information for care management team Advised patient to contact CCM LCSW if he has any mental health concerns Assisted patient/caregiver with obtaining information about health plan benefits Provided education and assistance to client regarding Advanced Directives. Encouraged patient to contact [facility] or [facility] for long term follow up and therapy/counseling. Text message sent to patient with these resources on 10/29/20. Mindfulness and Relaxation Training education provided to patient Active listening utilized, counseling provided, current coping strategies identified, decision-making supported, healthy lifestyle promoted, journaling promoted, meditative movement therapy encouraged, mindfulness encouraged, participation in counseling encouraged, problem-solving facilitated, relaxation techniques promoted, self-reflection promoted, spiritual activities promoted, verbalization of feelings encouraged, affirmation provided, community involvement promoted, expression of thoughts about present/future encouraged, independence in all possible areas promote, life review by  storytelling encouraged, patient strengths promoted, psychosocial concerns monitored, self-expression encouraged, sleep diary encouraged, sleep hygiene techniques encouraged, social relationships promoted, strategies to maintain hearing and/or vision promoted and wellness behaviors promoted   Patient Self Care Activities:  Attends all scheduled provider appointments Calls provider office for new concerns or questions Ability for insight Independent living Motivation for treatment   Patient Coping Strengths:  Supportive Relationships Hopefulness Self Advocate Able to Communicate Effectively   Patient Self Care Deficits:  Lacks social connections          Review of Systems Per HPI    Current Outpatient Medications (Cardiovascular):    rosuvastatin  (CRESTOR ) 10 MG tablet, TAKE 1 TABLET(10 MG) BY MOUTH DAILY  Current Outpatient Medications (Respiratory):    Fluticasone-Umeclidin-Vilant (TRELEGY ELLIPTA ) 100-62.5-25 MCG/ACT AEPB, Inhale 1 puff into the lungs daily.  Current Outpatient Medications (Analgesics):    indomethacin  (INDOCIN ) 50 MG capsule, Take 1 capsule (50 mg total) by mouth 2 (two) times daily as needed.   oxyCODONE  (ROXICODONE ) 5 MG immediate release tablet, Take 1 tablet (5 mg total) by mouth every 8 (eight) hours as needed for up to 12 doses.   Current Outpatient Medications (Other):    omeprazole  (PRILOSEC) 20 MG capsule, Take 1 capsule (20 mg total) by mouth daily.   Patient Active Problem List   Diagnosis Date Noted   Hypercholesterolemia 09/23/2021   Depression, major, single episode, severe (HCC) 09/25/2020   COPD (chronic obstructive pulmonary disease) (HCC) 05/11/2020   Gout 04/01/2020   Nicotine dependence, cigarettes, uncomplicated 04/01/2020   Chronic low back pain 04/01/2020   Class 3 severe obesity due to excess calories without serious comorbidity with body mass index (BMI) of 45.0 to 49.9 in adult    Chronic gouty arthritis 11/19/2015      Objective:   Vitals:   06/06/24 1033 06/06/24 1043  BP: (!) 159/89 (!) 147/91  Pulse: (!) 52 (!) 56  Temp: (!) 97.5 F (36.4 C)   Height: 5' 11 (1.803 m)   Weight: (!) 303 lb (137.4 kg)   SpO2: 95%   TempSrc: Oral   BMI (Calculated): 42.28     Physical Exam Constitutional:      Appearance:  Normal appearance.  HENT:     Head: Normocephalic and atraumatic.  Eyes:     Pupils: Pupils are equal, round, and reactive to light.  Cardiovascular:     Rate and Rhythm: Normal rate and regular rhythm.     Pulses: Normal pulses.     Heart sounds: Normal heart sounds.  Pulmonary:     Effort: Pulmonary effort is normal.     Breath sounds: Normal breath sounds.  Abdominal:     General: Abdomen is flat.     Palpations: Abdomen is soft.  Musculoskeletal:        General: Normal range of motion.     Cervical back: Normal range of motion.  Skin:    General: Skin is warm and dry.     Capillary Refill: Capillary refill takes less than 2 seconds.  Neurological:     General: No focal deficit present.     Mental Status: He is alert. Mental status is at baseline.  Psychiatric:        Mood and Affect: Mood normal.        Behavior: Behavior normal.      Assessment and Plan:   Annual physical exam Discussed lifestyle modifications and goals including plant based eating styles (such as: Mediterranean eating style), regular exercise (at least 150 min of moderate-intensity aerobic exercise per week, given AHA workout handout), get adequate sleep, and continue working with PCP towards meeting health goals to ensure healthy aging.  -     Hemoglobin A1c -     Lipid panel -     Comprehensive metabolic panel with GFR -     CBC -     Ambulatory referral to Optometry -     Hemoglobin A1c  Encounter for behavioral health screening As part of their intake evaluation, the patient was screened for depression, anxiety.  PHQ9 SCORE 1, GAD7 SCORE 1. Screening results negative for tested  conditions. See plan under problem/diagnosis above.  Elevated blood pressure reading Recommending home checks. Declines tx at this time. F/u w PCP and re-evaluate.  This plan was discussed with the patient and questions were answered. There were no further concerns.  Follow up as indicated, or sooner should any new problems arise, if conditions worsen, or if they are otherwise concerned.   See patient instructions for additional information.  Hadassah SHAUNNA Nett, MD Family Medicine       Future Appointments  Date Time Provider Department Center  06/10/2025  8:00 AM Melvin Pao, NP [facility] [address]

## 2024-06-06 NOTE — Patient Instructions (Signed)
Measure your blood pressure at home! Home Blood Pressure Monitoring is an important part of managing blood pressure and thought to be more accurate than the measures we get in the clinic.  Here's some tips on how to take your blood pressure accurately at home and some highly rated monitors. Most insurances (except for Medicaid) won't pay for monitors, so unfortunately they are an out-of-pocket expense for most people.  Taking an accurate blood pressure measurement: To get an accurate blood pressure reading, empty your bladder first, then rest in a seated position for at least 5 minutes. Ideally, no caffeine or tobacco use in last 30 minutes. Use an arm cuff (not wrist - see recommendations below) seated in a chair with a back next to a table or object that is high enough that you can rest your arm so the blood pressure cuff is at the level of your heart and you can lean back comfortably. Keep both feet on the floor and don't talk while the machine is working. Checking at different times of the day can be helpful to get an idea of your average numbers. Your goal blood pressure should be <140/90.

## 2024-06-07 LAB — CBC
Hematocrit: 44.3 % (ref 37.5–51.0)
Hemoglobin: 14.4 g/dL (ref 13.0–17.7)
MCH: 29 pg (ref 26.6–33.0)
MCHC: 32.5 g/dL (ref 31.5–35.7)
MCV: 89 fL (ref 79–97)
Platelets: 151 x10E3/uL (ref 150–450)
RBC: 4.97 x10E6/uL (ref 4.14–5.80)
RDW: 12.9 % (ref 11.6–15.4)
WBC: 5.9 x10E3/uL (ref 3.4–10.8)

## 2024-06-07 LAB — HEMOGLOBIN A1C
Est. average glucose Bld gHb Est-mCnc: 114 mg/dL
Hgb A1c MFr Bld: 5.6 % (ref 4.8–5.6)

## 2024-06-07 LAB — COMPREHENSIVE METABOLIC PANEL WITH GFR
ALT: 26 IU/L (ref 0–44)
AST: 26 IU/L (ref 0–40)
Albumin: 4.2 g/dL (ref 3.8–4.9)
Alkaline Phosphatase: 136 IU/L — ABNORMAL HIGH (ref 44–121)
BUN/Creatinine Ratio: 9 (ref 9–20)
BUN: 9 mg/dL (ref 6–24)
Bilirubin Total: 0.4 mg/dL (ref 0.0–1.2)
CO2: 23 mmol/L (ref 20–29)
Calcium: 8.8 mg/dL (ref 8.7–10.2)
Chloride: 105 mmol/L (ref 96–106)
Creatinine, Ser: 1.01 mg/dL (ref 0.76–1.27)
Globulin, Total: 2.2 g/dL (ref 1.5–4.5)
Glucose: 97 mg/dL (ref 70–99)
Potassium: 4.7 mmol/L (ref 3.5–5.2)
Sodium: 142 mmol/L (ref 134–144)
Total Protein: 6.4 g/dL (ref 6.0–8.5)
eGFR: 87 mL/min/1.73 (ref >59)

## 2024-06-07 LAB — LIPID PANEL
Chol/HDL Ratio: 3.8 ratio (ref 0.0–5.0)
Cholesterol, Total: 107 mg/dL (ref 100–199)
HDL: 28 mg/dL — ABNORMAL LOW
LDL Chol Calc (NIH): 53 mg/dL (ref 0–99)
Triglycerides: 153 mg/dL — ABNORMAL HIGH (ref 0–149)
VLDL Cholesterol Cal: 26 mg/dL (ref 5–40)

## 2024-06-08 ENCOUNTER — Ambulatory Visit: Payer: Self-pay | Admitting: Pediatrics

## 2024-06-14 ENCOUNTER — Encounter: Payer: Self-pay | Admitting: Pediatrics

## 2024-07-26 ENCOUNTER — Other Ambulatory Visit: Payer: Self-pay | Admitting: Nurse Practitioner

## 2024-07-26 NOTE — Telephone Encounter (Signed)
 Copied from CRM [id number]. Topic: Clinical - Medication Refill >> Jul 26, 2024  2:10 PM Hadassah PARAS wrote: Medication: omeprazole  (PRILOSEC) 20 MG capsule  oxyCODONE  (ROXICODONE ) 5 MG immediate release tablet (requesting for medication to be switched over to PCP as prescriber)   Has the patient contacted their pharmacy? No, controlled substance has to contact PCP office  This is the patient's preferred pharmacy:  [id number], [place] - [address] AT [facility] [address] [place] [address] Phone: [phone] Fax: [phone]  Is this the correct pharmacy for this prescription? Yes If no, delete pharmacy and type the correct one.   Has the prescription been filled recently? No  Is the patient out of the medication? Yes  Has the patient been seen for an appointment in the last year OR does the patient have an upcoming appointment? Yes, seen Aug 20th  Can we respond through MyChart? Yes  Agent: Please be advised that Rx refills may take up to 3 business days. We ask that you follow-up with your pharmacy.

## 2024-07-27 ENCOUNTER — Other Ambulatory Visit: Payer: Self-pay | Admitting: Nurse Practitioner

## 2024-07-30 MED ORDER — OMEPRAZOLE 20 MG PO CPDR: 20.0000 mg | DELAYED_RELEASE_CAPSULE | Freq: Every day | ORAL | 0 refills | Status: DC

## 2024-07-30 NOTE — Telephone Encounter (Signed)
 Requested medication (s) are due for refill today: yes  Requested medication (s) are on the active medication list: yes  Last refill:  04/08/24  Future visit scheduled: yes  Notes to clinic:  Unable to refill per protocol, cannot delegate.      Requested Prescriptions  Pending Prescriptions Disp Refills   oxyCODONE  (ROXICODONE ) 5 MG immediate release tablet 12 tablet 0    Sig: Take 1 tablet (5 mg total) by mouth every 8 (eight) hours as needed for up to 12 doses.     Not Delegated - Analgesics:  Opioid Agonists Failed - 07/30/2024 12:16 PM      Failed - This refill cannot be delegated      Failed - Urine Drug Screen completed in last 360 days      Passed - Valid encounter within last 3 months    Recent Outpatient Visits           1 month ago Annual physical exam   [place] [facility] Hadassah SQUIBB, MD              Signed Prescriptions Disp Refills   omeprazole  (PRILOSEC) 20 MG capsule 90 capsule 0    Sig: Take 1 capsule (20 mg total) by mouth daily.     Gastroenterology: Proton Pump Inhibitors Passed - 07/30/2024 12:16 PM      Passed - Valid encounter within last 12 months    Recent Outpatient Visits           1 month ago Annual physical exam   [place]  [facility] Hadassah SQUIBB, MD

## 2024-07-30 NOTE — Telephone Encounter (Signed)
 Requested Prescriptions  Pending Prescriptions Disp Refills   omeprazole  (PRILOSEC) 20 MG capsule 90 capsule 0    Sig: Take 1 capsule (20 mg total) by mouth daily.     Gastroenterology: Proton Pump Inhibitors Passed - 07/30/2024 12:15 PM      Passed - Valid encounter within last 12 months    Recent Outpatient Visits           1 month ago Annual physical exam   [place] [facility] Hadassah SQUIBB, MD               oxyCODONE  (ROXICODONE ) 5 MG immediate release tablet 12 tablet 0    Sig: Take 1 tablet (5 mg total) by mouth every 8 (eight) hours as needed for up to 12 doses.     Not Delegated - Analgesics:  Opioid Agonists Failed - 07/30/2024 12:15 PM      Failed - This refill cannot be delegated      Failed - Urine Drug Screen completed in last 360 days      Passed - Valid encounter within last 3 months    Recent Outpatient Visits           1 month ago Annual physical exam   [place] [facility] Hadassah SQUIBB, MD

## 2024-07-30 NOTE — Telephone Encounter (Signed)
 Requested Prescriptions  Pending Prescriptions Disp Refills   rosuvastatin  (CRESTOR ) 10 MG tablet [Pharmacy Med Name: ROSUVASTATIN  10MG  TABLETS] 90 tablet 0    Sig: TAKE 1 TABLET(10 MG) BY MOUTH DAILY     Cardiovascular:  Antilipid - Statins 2 Failed - 07/30/2024  3:47 PM      Failed - Lipid Panel in normal range within the last 12 months    Cholesterol, Total  Date Value Ref Range Status  06/06/2024 107 100 - 199 mg/dL Final   LDL Chol Calc (NIH)  Date Value Ref Range Status  06/06/2024 53 0 - 99 mg/dL Final   HDL  Date Value Ref Range Status  06/06/2024 28 (L) >39 mg/dL Final   Triglycerides  Date Value Ref Range Status  06/06/2024 153 (H) 0 - 149 mg/dL Final         Passed - Cr in normal range and within 360 days    Creatinine  Date Value Ref Range Status  04/16/2013 1.18 0.60 - 1.30 mg/dL Final   Creatinine, Ser  Date Value Ref Range Status  06/06/2024 1.01 0.76 - 1.27 mg/dL Final         Passed - Patient is not pregnant      Passed - Valid encounter within last 12 months    Recent Outpatient Visits           1 month ago Annual physical exam   [place] [facility] Hadassah SQUIBB, MD

## 2024-10-09 ENCOUNTER — Emergency Department

## 2024-10-09 ENCOUNTER — Emergency Department
Admission: EM | Admit: 2024-10-09 | Discharge: 2024-10-09 | Disposition: A | Discharge status: Home / Self Care | Attending: Emergency Medicine | Admitting: Emergency Medicine

## 2024-10-09 ENCOUNTER — Other Ambulatory Visit: Payer: Self-pay

## 2024-10-09 DIAGNOSIS — N200 Calculus of kidney: Secondary | ICD-10-CM | POA: Diagnosis not present

## 2024-10-09 DIAGNOSIS — R1031 Right lower quadrant pain: Secondary | ICD-10-CM | POA: Diagnosis present

## 2024-10-09 DIAGNOSIS — Z79899 Other long term (current) drug therapy: Secondary | ICD-10-CM | POA: Diagnosis not present

## 2024-10-09 LAB — URINALYSIS, ROUTINE W REFLEX MICROSCOPIC
Bacteria, UA: NONE SEEN
Bilirubin Urine: NEGATIVE
Glucose, UA: NEGATIVE mg/dL
Hgb urine dipstick: NEGATIVE
Ketones, ur: NEGATIVE mg/dL
Leukocytes,Ua: NEGATIVE
Nitrite: NEGATIVE
Protein, ur: 30 mg/dL — AB
Specific Gravity, Urine: 1.024 (ref 1.005–1.030)
Squamous Epithelial / HPF: 0 /HPF (ref 0–5)
pH: 8 (ref 5.0–8.0)

## 2024-10-09 LAB — CBC
HCT: 45.1 % (ref 39.0–52.0)
Hemoglobin: 15.2 g/dL (ref 13.0–17.0)
MCH: 28.8 pg (ref 26.0–34.0)
MCHC: 33.7 g/dL (ref 30.0–36.0)
MCV: 85.6 fL (ref 80.0–100.0)
Platelets: 188 K/uL (ref 150–400)
RBC: 5.27 MIL/uL (ref 4.22–5.81)
RDW: 12.1 % (ref 11.5–15.5)
WBC: 9 K/uL (ref 4.0–10.5)
nRBC: 0 % (ref 0.0–0.2)

## 2024-10-09 LAB — BASIC METABOLIC PANEL WITH GFR
Anion gap: 13 (ref 5–15)
BUN: 12 mg/dL (ref 6–20)
CO2: 25 mmol/L (ref 22–32)
Calcium: 8.7 mg/dL — ABNORMAL LOW (ref 8.9–10.3)
Chloride: 105 mmol/L (ref 98–111)
Creatinine, Ser: 1.27 mg/dL — ABNORMAL HIGH (ref 0.61–1.24)
GFR, Estimated: >60 mL/min
Glucose, Bld: 108 mg/dL — ABNORMAL HIGH (ref 70–99)
Potassium: 4.5 mmol/L (ref 3.5–5.1)
Sodium: 143 mmol/L (ref 135–145)

## 2024-10-09 MED ORDER — ONDANSETRON HCL 4 MG/2ML IJ SOLN
4.0000 mg | Freq: Once | INTRAMUSCULAR | Status: AC
  Administered 2024-10-09: 4 mg via INTRAVENOUS
  Filled 2024-10-09: qty 2

## 2024-10-09 MED ORDER — KETOROLAC TROMETHAMINE 30 MG/ML IJ SOLN
15.0000 mg | Freq: Once | INTRAMUSCULAR | Status: AC
  Administered 2024-10-09: 15 mg via INTRAVENOUS
  Filled 2024-10-09: qty 1

## 2024-10-09 MED ORDER — ONDANSETRON 8 MG PO TBDP: 8.0000 mg | ORAL_TABLET | Freq: Three times a day (TID) | ORAL | 0 refills | Status: AC | PRN

## 2024-10-09 MED ORDER — TAMSULOSIN HCL 0.4 MG PO CAPS: 0.4000 mg | ORAL_CAPSULE | Freq: Every day | ORAL | 0 refills | Status: AC

## 2024-10-09 MED ORDER — TAMSULOSIN HCL 0.4 MG PO CAPS
0.8000 mg | ORAL_CAPSULE | Freq: Once | ORAL | Status: AC
  Administered 2024-10-09: 0.8 mg via ORAL
  Filled 2024-10-09: qty 2

## 2024-10-09 MED ORDER — HYDROMORPHONE HCL 1 MG/ML IJ SOLN
1.0000 mg | Freq: Once | INTRAMUSCULAR | Status: AC
  Administered 2024-10-09: 1 mg via INTRAVENOUS
  Filled 2024-10-09: qty 1

## 2024-10-09 MED ORDER — KETOROLAC TROMETHAMINE 10 MG PO TABS: 10.0000 mg | ORAL_TABLET | Freq: Four times a day (QID) | ORAL | 0 refills | Status: AC | PRN

## 2024-10-09 MED ORDER — OXYCODONE-ACETAMINOPHEN 5-325 MG PO TABS: 1.0000 | ORAL_TABLET | ORAL | 0 refills | Status: AC | PRN

## 2024-10-09 MED ORDER — LACTATED RINGERS IV BOLUS
1000.0000 mL | Freq: Once | INTRAVENOUS | Status: AC
  Administered 2024-10-09: 1000 mL via INTRAVENOUS

## 2024-10-09 NOTE — ED Provider Notes (Signed)
 "  [facility] Provider Note   Event Date/Time   First MD Initiated Contact with Patient 10/09/24 0930     (approximate) History  Abdominal Pain  HPI Walter Mcclain. is a 58 y.o. male with a stated past medical history of kidney stones who presents complaining of right flank pain that began at approximately 2 AM last night and has been associated with nausea and vomiting since onset.  Patient describes a somewhat waxing and waning pain that is at worst a 9/10 and at best a 5/10.  Patient states that this pain is now radiating around into the right lower quadrant and into the groin.  Patient states that he is having significant polyuria.  Patient has not tried any medications for these symptoms ROS: Patient currently denies any vision changes, tinnitus, difficulty speaking, facial droop, sore throat, chest pain, shortness of breath, diarrhea, dysuria, or weakness/numbness/paresthesias in any extremity   Physical Exam  Triage Vital Signs: ED Triage Vitals  Encounter Vitals Group     BP 10/09/24 0918 (!) 135/95     Girls Systolic BP Percentile --      Girls Diastolic BP Percentile --      Boys Systolic BP Percentile --      Boys Diastolic BP Percentile --      Pulse Rate 10/09/24 0918 75     Resp 10/09/24 0918 18     Temp 10/09/24 0918 98 F (36.7 C)     Temp src --      SpO2 10/09/24 0918 93 %     Weight 10/09/24 0917 (!) 301 lb (136.5 kg)     Height 10/09/24 0917 5' 11 (1.803 m)     Head Circumference --      Peak Flow --      Pain Score 10/09/24 0916 10     Pain Loc --      Pain Education --      Exclude from Growth Chart --    Most recent vital signs: Vitals:   10/09/24 0918  BP: (!) 135/95  Pulse: 75  Resp: 18  Temp: 98 F (36.7 C)  SpO2: 93%   General: Awake, oriented x4. CV:  Good peripheral perfusion. Resp:  Normal effort. Abd:  Obese.  Right lower quadrant right flank tenderness to palpation Other:  Morbid obese Caucasian resting  uncomfortably in stretcher secondary to pain ED Results / Procedures / Treatments  Labs (all labs ordered are listed, but only abnormal results are displayed) Labs Reviewed  URINALYSIS, ROUTINE W REFLEX MICROSCOPIC - Abnormal; Notable for the following components:      Result Value   Color, Urine YELLOW (*)    APPearance CLEAR (*)    Protein, ur 30 (*)    All other components within normal limits  BASIC METABOLIC PANEL WITH GFR - Abnormal; Notable for the following components:   Glucose, Bld 108 (*)    Creatinine, Ser 1.27 (*)    Calcium  8.7 (*)    All other components within normal limits  CBC   RADIOLOGY ED MD interpretation: CT of the abdomen and pelvis without IV contrast independently interpreted and shows a 3 x 4 mm right UVJ stone causing mild upstream obstructive uropathy - All radiology independently interpreted and agree with radiology assessment Official radiology report(s): CT Renal Stone Study Result Date: 10/09/2024 CLINICAL DATA:  Right-sided flank pain. EXAM: CT ABDOMEN AND PELVIS WITHOUT CONTRAST TECHNIQUE: Multidetector CT imaging of the abdomen and pelvis was  performed following the standard protocol without IV contrast. RADIATION DOSE REDUCTION: This exam was performed according to the departmental dose-optimization program which includes automated exposure control, adjustment of the mA and/or kV according to patient size and/or use of iterative reconstruction technique. COMPARISON:  CT scan renal stone protocol from 11/30/2021. FINDINGS: Lower chest: There are subpleural atelectatic changes in the visualized lung bases. No overt consolidation. No pleural effusion. The heart is normal in size. No pericardial effusion. Hepatobiliary: The liver is normal in size. Non-cirrhotic configuration. No suspicious mass. These is mild-to-moderate diffuse hepatic steatosis. Several geographic areas of hyperattenuation along the gallbladder fossa are nonspecific but favored to represent  areas of fatty sparing. No intrahepatic or extrahepatic bile duct dilation. Small volume dependent subcentimeter sized calcified gallstones noted without imaging signs of acute cholecystitis. Pancreas: Unremarkable. No pancreatic ductal dilatation or surrounding inflammatory changes. Spleen: Within normal limits. No focal lesion. Adrenals/Urinary Tract: Adrenal glands are unremarkable. No suspicious renal mass within the limitations of this unenhanced exam. There is a 3 x 4 mm right vesicoureteric junction calculus causing mild upstream obstructive uropathy. There is an additional 2 mm calculus in the right kidney upper pole. There is a single 2-3 mm nonobstructing calculus in the left kidney upper pole. No left ureterolithiasis or obstructive uropathy. Urinary bladder is partially distended but otherwise unremarkable. Stomach/Bowel: There is a tiny sliding hiatal hernia. No disproportionate dilation of the small or large bowel loops. No evidence of abnormal bowel wall thickening or inflammatory changes. The appendix is unremarkable. Vascular/Lymphatic: No ascites or pneumoperitoneum. No abdominal or pelvic lymphadenopathy, by size criteria. No aneurysmal dilation of the major abdominal arteries. There are mild peripheral atherosclerotic vascular calcifications of the aorta and its major branches. Reproductive: Normal size prostate. Symmetric seminal vesicles. Other: There are fat containing umbilical and bilateral inguinal hernias. The soft tissues and abdominal wall are otherwise unremarkable. Musculoskeletal: No suspicious osseous lesions. There are moderate multilevel degenerative changes in the visualized spine. 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 IMPRESSION: 1. There is a 3 x 4 mm right vesicoureteric junction calculus causing mild upstream obstructive uropathy. There are additional bilateral nonobstructing renal calculi, as described above. No left  hydroureteronephrosis. 2. Multiple other nonacute observations, as described above. Aortic Atherosclerosis (ICD10-I70.0). Electronically Signed   By: Ree Molt M.D.   On: 10/09/2024 10:41   PROCEDURES: Critical Care performed: No Procedures MEDICATIONS ORDERED IN ED: Medications  tamsulosin  (FLOMAX ) capsule 0.8 mg (has no administration in time range)  ketorolac  (TORADOL ) 30 MG/ML injection 15 mg (15 mg Intravenous Given 10/09/24 1011)  HYDROmorphone  (DILAUDID ) injection 1 mg (1 mg Intravenous Given 10/09/24 1011)  ondansetron  (ZOFRAN ) injection 4 mg (4 mg Intravenous Given 10/09/24 1011)  lactated ringers  bolus 1,000 mL (1,000 mLs Intravenous New Bag/Given 10/09/24 1014)   IMPRESSION / MDM / ASSESSMENT AND PLAN / ED COURSE  I reviewed the triage vital signs and the nursing notes.                             The patient is on the cardiac monitor to evaluate for evidence of arrhythmia and/or significant heart rate changes. Patient's presentation is most consistent with acute presentation with potential threat to life or bodily function. Patient is a 58 year old male with above-stated past history presents complaining of right flank and right lower quadrant abdominal pain with associated nausea/vomiting that began overnight. DDx: Kidney stone, pyelonephritis, appendicitis, biliary  disease, gastroenteritis Plan: CBC, BMP, UA CT renal stone study, pain/nausea control  Patient CT scan has shown a 3 x 4 mm stone at the UVJ on the right.  Urinalysis shows no evidence of of infection and patient's pain is well-controlled.  Will continue with outpatient pain control and plan to follow-up with urology as needed.  Patient agrees with plan and all questions answered.  Patient given strict return precautions prior to discharge  Dispo: Discharge home with PCP and urologic follow-up as needed   FINAL CLINICAL IMPRESSION(S) / ED DIAGNOSES   Final diagnoses:  Kidney stone on right side   Rx / DC  Orders   ED Discharge Orders          Ordered    oxyCODONE -acetaminophen  (PERCOCET) 5-325 MG tablet  Every 4 hours PRN        10/09/24 1050    ketorolac  (TORADOL ) 10 MG tablet  Every 6 hours PRN       Note to Pharmacy: Patient given an IM/IV loading dose in emergency department   10/09/24 1050    tamsulosin  (FLOMAX ) 0.4 MG CAPS capsule  Daily        10/09/24 1050    ondansetron  (ZOFRAN -ODT) 8 MG disintegrating tablet  Every 8 hours PRN        10/09/24 1050           Note:  This document was prepared using Dragon voice recognition software and may include unintentional dictation errors.   Jossie Artist POUR, MD 10/09/24 1055  "

## 2024-10-09 NOTE — ED Notes (Signed)
 See triage note  Presents with sudden onset of abd pain  States pain started at 3 am  Pos n/v  States pain has progressed

## 2024-10-09 NOTE — ED Triage Notes (Signed)
 Pt comes with right sided flank pain pt stats it woke him up. Pt has hx of kidney stones and thinks it is that. Pt states N/V

## 2024-10-26 ENCOUNTER — Other Ambulatory Visit: Payer: Self-pay | Admitting: Nurse Practitioner

## 2024-10-26 NOTE — Telephone Encounter (Signed)
 Discontinued 10/09/24.  Requested Prescriptions  Pending Prescriptions Disp Refills   indomethacin  (INDOCIN ) 50 MG capsule [Pharmacy Med Name: INDOMETHACIN  50MG  CAPSULES] 180 capsule 0    Sig: TAKE 1 CAPSULE(50 MG) BY MOUTH TWICE DAILY AS NEEDED     Analgesics:  NSAIDS Failed - 10/26/2024  3:51 PM      Failed - Manual Review: Labs are only required if the patient has taken medication for more than 8 weeks.      Failed - Cr in normal range and within 360 days    Creatinine  Date Value Ref Range Status  04/16/2013 1.18 0.60 - 1.30 mg/dL Final   Creatinine, Ser  Date Value Ref Range Status  10/09/2024 1.27 (H) 0.61 - 1.24 mg/dL Final         Passed - HGB in normal range and within 360 days    Hemoglobin  Date Value Ref Range Status  10/09/2024 15.2 13.0 - 17.0 g/dL Final  91/79/7974 85.5 13.0 - 17.7 g/dL Final         Passed - PLT in normal range and within 360 days    Platelets  Date Value Ref Range Status  10/09/2024 188 150 - 400 K/uL Final  06/06/2024 151 150 - 450 x10E3/uL Final         Passed - HCT in normal range and within 360 days    HCT  Date Value Ref Range Status  10/09/2024 45.1 39.0 - 52.0 % Final   Hematocrit  Date Value Ref Range Status  06/06/2024 44.3 37.5 - 51.0 % Final         Passed - eGFR is 30 or above and within 360 days    EGFR (African American)  Date Value Ref Range Status  04/16/2013 >60  Final   GFR calc Af Amer  Date Value Ref Range Status  05/08/2020 93 >59 mL/min/1.73 Final    Comment:    **[facility] currently reports eGFR in compliance with the current**   recommendations of the [employer]. [facility] will   update reporting as new guidelines are published from the NKF-ASN   Task force.    EGFR (Non-African Amer.)  Date Value Ref Range Status  04/16/2013 >60  Final    Comment:    eGFR values <40mL/min/1.73 m2 may be an indication of chronic kidney disease (CKD). Calculated eGFR is useful in patients with stable  renal function. The eGFR calculation will not be reliable in acutely ill patients when serum creatinine is changing rapidly. It is not useful in  patients on dialysis. The eGFR calculation may not be applicable to patients at the low and high extremes of body sizes, pregnant women, and vegetarians.    GFR, Estimated  Date Value Ref Range Status  10/09/2024 >60 >60 mL/min Final    Comment:    (NOTE) Calculated using the CKD-EPI Creatinine Equation (2021)    eGFR  Date Value Ref Range Status  06/06/2024 87 >59 mL/min/1.73 Final         Passed - Patient is not pregnant      Passed - Valid encounter within last 12 months    Recent Outpatient Visits           4 months ago Annual physical exam   [place] [facility] Herold Hadassah SQUIBB, MD

## 2024-10-28 ENCOUNTER — Other Ambulatory Visit: Payer: Self-pay | Admitting: Nurse Practitioner

## 2024-10-30 NOTE — Telephone Encounter (Signed)
 Requested Prescriptions  Pending Prescriptions Disp Refills   omeprazole  (PRILOSEC) 20 MG capsule [Pharmacy Med Name: OMEPRAZOLE  20MG  CAPSULES] 90 capsule 1    Sig: TAKE 1 CAPSULE(20 MG) BY MOUTH DAILY     Gastroenterology: Proton Pump Inhibitors Passed - 10/30/2024  8:04 AM      Passed - Valid encounter within last 12 months    Recent Outpatient Visits           4 months ago Annual physical exam   [place] [facility] Herold Hadassah SQUIBB, MD               rosuvastatin  (CRESTOR ) 10 MG tablet [Pharmacy Med Name: ROSUVASTATIN  10MG  TABLETS] 90 tablet 1    Sig: TAKE 1 TABLET(10 MG) BY MOUTH DAILY     Cardiovascular:  Antilipid - Statins 2 Failed - 10/30/2024  8:04 AM      Failed - Cr in normal range and within 360 days    Creatinine  Date Value Ref Range Status  04/16/2013 1.18 0.60 - 1.30 mg/dL Final   Creatinine, Ser  Date Value Ref Range Status  10/09/2024 1.27 (H) 0.61 - 1.24 mg/dL Final         Failed - Lipid Panel in normal range within the last 12 months    Cholesterol, Total  Date Value Ref Range Status  06/06/2024 107 100 - 199 mg/dL Final   LDL Chol Calc (NIH)  Date Value Ref Range Status  06/06/2024 53 0 - 99 mg/dL Final   HDL  Date Value Ref Range Status  06/06/2024 28 (L) >39 mg/dL Final   Triglycerides  Date Value Ref Range Status  06/06/2024 153 (H) 0 - 149 mg/dL Final         Passed - Patient is not pregnant      Passed - Valid encounter within last 12 months    Recent Outpatient Visits           4 months ago Annual physical exam   [place] [facility] Herold Hadassah SQUIBB, MD

## 2025-06-10 ENCOUNTER — Ambulatory Visit: Admitting: Nurse Practitioner
